# Patient Record
Sex: Male | Born: 1935
Health system: Southern US, Community
[De-identification: ages and names within clinical notes are randomized; demographics above are authoritative.]

## PROBLEM LIST (undated history)

## (undated) DIAGNOSIS — N400 Enlarged prostate without lower urinary tract symptoms: Secondary | ICD-10-CM

## (undated) DIAGNOSIS — I10 Essential (primary) hypertension: Secondary | ICD-10-CM

## (undated) DIAGNOSIS — I499 Cardiac arrhythmia, unspecified: Secondary | ICD-10-CM

## (undated) DIAGNOSIS — N189 Chronic kidney disease, unspecified: Secondary | ICD-10-CM

## (undated) DIAGNOSIS — M1990 Unspecified osteoarthritis, unspecified site: Secondary | ICD-10-CM

## (undated) DIAGNOSIS — E119 Type 2 diabetes mellitus without complications: Secondary | ICD-10-CM

## (undated) DIAGNOSIS — K649 Unspecified hemorrhoids: Secondary | ICD-10-CM

## (undated) HISTORY — PX: MEDIAL PARTIAL KNEE REPLACEMENT: SHX5965

## (undated) HISTORY — DX: Benign prostatic hyperplasia without lower urinary tract symptoms: N40.0

## (undated) HISTORY — DX: Unspecified hemorrhoids: K64.9

## (undated) HISTORY — DX: Unspecified osteoarthritis, unspecified site: M19.90

## (undated) HISTORY — PX: ROTATOR CUFF REPAIR: SHX139

## (undated) HISTORY — DX: Type 2 diabetes mellitus without complications: E11.9

## (undated) HISTORY — DX: Chronic kidney disease, unspecified: N18.9

## (undated) HISTORY — PX: INGUINAL HERNIA REPAIR: SUR1180

---

## 2002-12-15 ENCOUNTER — Ambulatory Visit (HOSPITAL_COMMUNITY): Admission: RE | Admit: 2002-12-15 | Discharge: 2002-12-15 | Payer: Self-pay | Admitting: Family Medicine

## 2002-12-15 ENCOUNTER — Encounter: Payer: Self-pay | Admitting: Family Medicine

## 2004-01-13 ENCOUNTER — Ambulatory Visit (HOSPITAL_COMMUNITY): Admission: RE | Admit: 2004-01-13 | Discharge: 2004-01-13 | Payer: Self-pay | Admitting: Family Medicine

## 2004-03-11 ENCOUNTER — Ambulatory Visit (HOSPITAL_COMMUNITY): Admission: RE | Admit: 2004-03-11 | Discharge: 2004-03-11 | Payer: Self-pay | Admitting: Family Medicine

## 2004-12-06 ENCOUNTER — Ambulatory Visit (HOSPITAL_COMMUNITY): Admission: RE | Admit: 2004-12-06 | Discharge: 2004-12-06 | Payer: Self-pay | Admitting: *Deleted

## 2004-12-06 ENCOUNTER — Ambulatory Visit (HOSPITAL_BASED_OUTPATIENT_CLINIC_OR_DEPARTMENT_OTHER): Admission: RE | Admit: 2004-12-06 | Discharge: 2004-12-06 | Payer: Self-pay | Admitting: *Deleted

## 2006-07-24 ENCOUNTER — Ambulatory Visit (HOSPITAL_COMMUNITY): Admission: RE | Admit: 2006-07-24 | Discharge: 2006-07-24 | Payer: Self-pay | Admitting: Family Medicine

## 2007-09-13 ENCOUNTER — Ambulatory Visit (HOSPITAL_COMMUNITY): Admission: RE | Admit: 2007-09-13 | Discharge: 2007-09-13 | Payer: Self-pay | Admitting: Family Medicine

## 2009-01-25 HISTORY — PX: COLONOSCOPY: SHX174

## 2010-08-12 NOTE — Op Note (Signed)
NAME:  Jacob Sullivan, Jacob Sullivan                 ACCOUNT NO.:  [id number]   MEDICAL RECORD NO.:  [id number]          PATIENT TYPE:  AMB   LOCATION:  DSC                          FACILITY:  [facility]   PHYSICIAN:  Deidre Ala, M.D.    DATE OF BIRTH:  11/22/1927   DATE OF PROCEDURE:  12/06/2004  DATE OF DISCHARGE:                                 OPERATIVE REPORT   PREOPERATIVE DIAGNOSIS:  1.  Left shoulder impingement syndrome with type 3 acromion.  2.  Acromioclavicular joint arthritis.  3.  Rule out rotator cuff tear.   POSTOPERATIVE DIAGNOSIS:  1.  Impingement syndrome with type 3 acromion, left shoulder.  2.  Partial thickness but not through and through rotator cuff tear with      flap.  3.  Severe acromioclavicular joint arthritis.  4.  Subdeltoid bursitis.  5.  Marked fraying of the glenoid labrum with type I SLAP without complete      detachment and intact long head biceps, glenohumeral joint looked good.   OPERATION:  1.  Left shoulder operative arthroscopy with subacromial arch decompression      acromioplasty.  2.  Arthroscopic distal clavicle resection.  3.  Subdeltoid bursectomy and debridement partial thickness rotator cuff      tear.  4.  Debridement glenoid labral tear and SLAP partial tear.   SURGEON:  1.  Charlesetta Shanks, M.D.   ASSISTANT:  Clarene Reamer, P.A.-C.   ANESTHESIA:  General endotracheal anesthesia.   PATHOLOGIC FINDINGS AND HISTORY:  [name] is a long term patient of mine  who presented to me seven months ago with increasing left shoulder pain with  the above x-rays.  He was given a cortisone injection.  He came back on  May 23, 2004, with improvement in his symptoms after injection on  May 02, 2004.  He then returned on November 21, 2004, was having  increasing difficulty, and desired to proceed with surgical intervention.   At surgery, he had glenoid labral fraying anteriorly.  His long head biceps  was intact.  There was some stretch of the SLAP  area attachment to the  biceps anchor but it was still intact.  He had fraying of the posterior  glenoid.  The glenohumeral surface looked good.  The under surface of the  acromion had marked synovitis and inflammation and frayed rotator cuff.  He  had a sharp type 2 acromion with very arthritic distal clavicle.  He had a  SLAP delamination of the rotator cuff centrally about 1 cm down from the  glenohumeral junction to 1.5 cm and it was rounded off but not further  debrided and it was not through and through.  He had fraying in the critical  zone.  This was also debrided to smooth with the ablator on 1.   PROCEDURE:  With adequate anesthesia obtained using endotracheal technique,  1 gram Ancef was given IV prophylaxis, the patient was placed in the supine  beach chair position.  The left shoulder was prepped and draped in a  standard fashion.  The patient had had a  scalene block prior.  The shoulder  was then prepped and draped in a standard fashion.  Skin markings were made  for anatomic positioning.  We then injected 20 mL of 0.5% Marcaine with  epinephrine to open up the subacromial space.  I then entered the shoulder  through a posterior portal, anterior portal was established just lateral to  the coracoid.  I then debrided, after probing, the anterior superior labrum,  and used the ablator on 1 to smooth.  Anterior triangle synovitis was also  removed as well as some synovitis on the under surface of the rotator cuff.  I then reversed the portals and similar shavings were carried out.  I then  entered the subacromial space through the posterior portal, anterolateral  portal was established.  I then debrided the anterior under surface of the  shaver and used the ablator on 1 to cauterize.  I then brought in the 6 bur  and completed acromioplasty to the roof of the subacromial space in the  manner of Caspari.  I then turned the scope medially sideways and debrided  the exiting meniscus  with basket and shaver.  I then debrided the distal  clavicle two shaver breadths in and smoothed it on the edges with an  ablator.  I then entered the shoulder from the anterolateral portal,  completed acromioplasty back to the bicortical bone in the manner of  Caspari.  I then debrided the rotator cuff to smooth, flat, and tapered over  the flap, with internal and external rotation, neutral, and abduction.  The  ablator was used on one to smooth.  Bleeding points were cauterized.  When I  was satisfied with the resection, the shoulder was irrigated through the  scope.  0.5% Marcaine was not used due to the block.  The portal was left  open.  A bulky, sterile compressive dressing was applied with a sling.  The  patient tolerated the procedure well, was awakened, and taken to the  recovery room in satisfactory condition.  He is to be per outpatient  routine, told to call the office tomorrow for appointment for recheck, given  Percocet for pain.           ______________________________  V. Charlesetta Shanks, M.D.     VEP/MEDQ  D:  12/06/2004  T:  12/06/2004  Job:  [id number]

## 2012-03-13 ENCOUNTER — Emergency Department (HOSPITAL_COMMUNITY): Payer: Medicare Other

## 2012-03-13 ENCOUNTER — Inpatient Hospital Stay (HOSPITAL_COMMUNITY)
Admission: EM | Admit: 2012-03-13 | Discharge: 2012-03-15 | DRG: 690 | Disposition: A | Discharge status: Home / Self Care | Payer: Medicare Other | Attending: Internal Medicine | Admitting: Internal Medicine

## 2012-03-13 ENCOUNTER — Encounter (HOSPITAL_COMMUNITY): Payer: Self-pay | Admitting: Emergency Medicine

## 2012-03-13 DIAGNOSIS — R6510 Systemic inflammatory response syndrome (SIRS) of non-infectious origin without acute organ dysfunction: Secondary | ICD-10-CM | POA: Diagnosis present

## 2012-03-13 DIAGNOSIS — N289 Disorder of kidney and ureter, unspecified: Secondary | ICD-10-CM

## 2012-03-13 DIAGNOSIS — I499 Cardiac arrhythmia, unspecified: Secondary | ICD-10-CM | POA: Diagnosis present

## 2012-03-13 DIAGNOSIS — I10 Essential (primary) hypertension: Secondary | ICD-10-CM | POA: Diagnosis present

## 2012-03-13 DIAGNOSIS — Z96659 Presence of unspecified artificial knee joint: Secondary | ICD-10-CM

## 2012-03-13 DIAGNOSIS — R000 Tachycardia, unspecified: Secondary | ICD-10-CM

## 2012-03-13 DIAGNOSIS — A419 Sepsis, unspecified organism: Secondary | ICD-10-CM

## 2012-03-13 DIAGNOSIS — R9431 Abnormal electrocardiogram [ECG] [EKG]: Secondary | ICD-10-CM | POA: Diagnosis present

## 2012-03-13 DIAGNOSIS — N390 Urinary tract infection, site not specified: Principal | ICD-10-CM | POA: Diagnosis present

## 2012-03-13 HISTORY — DX: Cardiac arrhythmia, unspecified: I49.9

## 2012-03-13 HISTORY — DX: Essential (primary) hypertension: I10

## 2012-03-13 LAB — CBC WITH DIFFERENTIAL/PLATELET
Basophils Absolute: 0 10*3/uL (ref 0.0–0.1)
Basophils Relative: 0 % (ref 0–1)
Eosinophils Absolute: 0 10*3/uL (ref 0.0–0.7)
Eosinophils Relative: 0 % (ref 0–5)
HCT: 41.3 % (ref 39.0–52.0)
Hemoglobin: 13.7 g/dL (ref 13.0–17.0)
Lymphocytes Relative: 3 % — ABNORMAL LOW (ref 12–46)
Lymphs Abs: 0.8 10*3/uL (ref 0.7–4.0)
MCH: 28.4 pg (ref 26.0–34.0)
MCHC: 33.2 g/dL (ref 30.0–36.0)
MCV: 85.5 fL (ref 78.0–100.0)
Monocytes Absolute: 1.5 10*3/uL — ABNORMAL HIGH (ref 0.1–1.0)
Monocytes Relative: 6 % (ref 3–12)
Neutro Abs: 23.5 10*3/uL — ABNORMAL HIGH (ref 1.7–7.7)
Neutrophils Relative %: 91 % — ABNORMAL HIGH (ref 43–77)
Platelets: 200 10*3/uL (ref 150–400)
RBC: 4.83 MIL/uL (ref 4.22–5.81)
RDW: 13.7 % (ref 11.5–15.5)
WBC: 25.8 10*3/uL — ABNORMAL HIGH (ref 4.0–10.5)

## 2012-03-13 LAB — URINALYSIS, ROUTINE W REFLEX MICROSCOPIC
Bilirubin Urine: NEGATIVE
Glucose, UA: NEGATIVE mg/dL
Ketones, ur: NEGATIVE mg/dL
Nitrite: POSITIVE — AB
Protein, ur: 30 mg/dL — AB
Specific Gravity, Urine: 1.025 (ref 1.005–1.030)
Urobilinogen, UA: 0.2 mg/dL (ref 0.0–1.0)
pH: 6 (ref 5.0–8.0)

## 2012-03-13 LAB — BASIC METABOLIC PANEL
BUN: 25 mg/dL — ABNORMAL HIGH (ref 6–23)
CO2: 27 mEq/L (ref 19–32)
Calcium: 8.9 mg/dL (ref 8.4–10.5)
Chloride: 97 mEq/L (ref 96–112)
Creatinine, Ser: 1.39 mg/dL — ABNORMAL HIGH (ref 0.50–1.35)
GFR calc Af Amer: 53 mL/min — ABNORMAL LOW (ref >90)
GFR calc non Af Amer: 46 mL/min — ABNORMAL LOW (ref >90)
Glucose, Bld: 163 mg/dL — ABNORMAL HIGH (ref 70–99)
Potassium: 3.5 mEq/L (ref 3.5–5.1)
Sodium: 136 mEq/L (ref 135–145)

## 2012-03-13 LAB — URINE MICROSCOPIC-ADD ON

## 2012-03-13 LAB — TSH: TSH: 3.419 u[IU]/mL (ref 0.350–4.500)

## 2012-03-13 LAB — T4, FREE: Free T4: 1.15 ng/dL (ref 0.80–1.80)

## 2012-03-13 LAB — TROPONIN I: Troponin I: <0.3 ng/mL (ref <0.30)

## 2012-03-13 LAB — T3, FREE: T3, Free: 1.9 pg/mL — ABNORMAL LOW (ref 2.3–4.2)

## 2012-03-13 MED ORDER — ENOXAPARIN SODIUM 40 MG/0.4ML ~~LOC~~ SOLN
40.0000 mg | SUBCUTANEOUS | Status: DC
  Administered 2012-03-13 – 2012-03-14 (×2): 40 mg via SUBCUTANEOUS
  Filled 2012-03-13 (×2): qty 0.4

## 2012-03-13 MED ORDER — SODIUM CHLORIDE 0.9 % IJ SOLN
3.0000 mL | Freq: Two times a day (BID) | INTRAMUSCULAR | Status: DC
  Administered 2012-03-14 (×2): 3 mL via INTRAVENOUS

## 2012-03-13 MED ORDER — SIMETHICONE 80 MG PO CHEW: 80.0000 mg | CHEWABLE_TABLET | Freq: Four times a day (QID) | ORAL | Status: DC | PRN

## 2012-03-13 MED ORDER — ALUM & MAG HYDROXIDE-SIMETH 200-200-20 MG/5ML PO SUSP: 30.0000 mL | Freq: Four times a day (QID) | ORAL | Status: DC | PRN

## 2012-03-13 MED ORDER — SENNOSIDES-DOCUSATE SODIUM 8.6-50 MG PO TABS: 1.0000 | ORAL_TABLET | Freq: Every evening | ORAL | Status: DC | PRN

## 2012-03-13 MED ORDER — ONDANSETRON HCL 4 MG/2ML IJ SOLN: 4.0000 mg | Freq: Four times a day (QID) | INTRAMUSCULAR | Status: DC | PRN

## 2012-03-13 MED ORDER — ACETAMINOPHEN 650 MG RE SUPP: 650.0000 mg | Freq: Four times a day (QID) | RECTAL | Status: DC | PRN

## 2012-03-13 MED ORDER — ONDANSETRON HCL 4 MG PO TABS: 4.0000 mg | ORAL_TABLET | Freq: Four times a day (QID) | ORAL | Status: DC | PRN

## 2012-03-13 MED ORDER — DEXTROSE 5 % IV SOLN
1.0000 g | INTRAVENOUS | Status: AC
  Administered 2012-03-14: 1 g via INTRAVENOUS
  Filled 2012-03-13: qty 10

## 2012-03-13 MED ORDER — TRAZODONE HCL 50 MG PO TABS: 50.0000 mg | ORAL_TABLET | Freq: Every evening | ORAL | Status: DC | PRN

## 2012-03-13 MED ORDER — POTASSIUM CHLORIDE IN NACL 40-0.9 MEQ/L-% IV SOLN
INTRAVENOUS | Status: DC
  Administered 2012-03-13 – 2012-03-14 (×3): via INTRAVENOUS

## 2012-03-13 MED ORDER — ASPIRIN EC 81 MG PO TBEC
81.0000 mg | DELAYED_RELEASE_TABLET | Freq: Every day | ORAL | Status: DC
  Administered 2012-03-13 – 2012-03-15 (×3): 81 mg via ORAL
  Filled 2012-03-13 (×3): qty 1

## 2012-03-13 MED ORDER — DEXTROSE 5 % IV SOLN
1.0000 g | Freq: Once | INTRAVENOUS | Status: AC
  Administered 2012-03-13: 1 g via INTRAVENOUS
  Filled 2012-03-13: qty 10

## 2012-03-13 MED ORDER — ACETAMINOPHEN 325 MG PO TABS: 650.0000 mg | ORAL_TABLET | Freq: Four times a day (QID) | ORAL | Status: DC | PRN

## 2012-03-13 MED ORDER — OXYCODONE HCL 5 MG PO TABS: 5.0000 mg | ORAL_TABLET | ORAL | Status: DC | PRN

## 2012-03-13 NOTE — ED Notes (Signed)
Pt c/o dizziness and palpitation since Monday. Pt was sent over by Dr. Phillips Odor. Pt is being treated for uti.

## 2012-03-13 NOTE — H&P (Signed)
Hospital Admission Note Date: 03/13/2012  Patient name: Jacob Sullivan Medical record number: [id number] Date of birth: February 14, 1928 Age: 78 y.o. Gender: male PCP: Kirk Ruths, MD  Chief Complaint: fever, dysuria  History of Present Illness:  Jacob Sullivan is an 78 y.o. male who presents with fevers to 100 at home, dysuria, frequency, nocturia. He has been weak. He felt nauseated yesterday. His heart has been racing. He went to see Dr. Phillips Odor and was found to have tachycardia and urinary tract infection. He was sent to the emergency room. Patient has a Sweda blood cell count of 25,000, urinary tract infection. His appetite has been poor. He is not following, but feels very dizzy. He is usually quite independent. He has received IV fluids and ceftriaxone in the emergency room.  Past Medical History  Diagnosis Date  . Irregular heartbeat   . Hypertension     Meds: See medicine reconciliation  Allergies: Review of patient's allergies indicates no known allergies. History   Social History  . Marital Status: Married    Spouse Name: N/A    Number of Children: N/A  . Years of Education: N/A   Occupational History  . Not on file.   Social History Main Topics  . Smoking status: Not on file  . Smokeless tobacco: Not on file  . Alcohol Use: No  . Drug Use: No  . Sexually Active:    Other Topics Concern  . Not on file   Social History Narrative  . No narrative on file   Family history: No urinary problems.  Past Surgical History  Procedure Date  . Medial partial knee replacement   . Rotator cuff repair     Review of Systems: Systems reviewed and as per HPI, otherwise negative.  Physical Exam: Blood pressure 113/58, pulse 105, temperature 97.6 F (36.4 C), temperature source Oral, resp. rate 18, height 5\' 9"  (1.753 m), weight 87.544 kg (193 lb), SpO2 98.00%. BP 113/58  Pulse 105  Temp 97.6 F (36.4 C) (Oral)  Resp 18  Ht 5\' 9"  (1.753 m)  Wt 87.544 kg (193 lb)   BMI 28.50 kg/m2  SpO2 98%  General Appearance:    Alert, slightly ill-appearing black male, oriented and cooperative   Head:    Normocephalic, without obvious abnormality, atraumatic  Eyes:    PERRL, conjunctiva/corneas clear, EOM's intact, fundi    benign, both eyes       Ears:    Normal TM's and external ear canals, both ears  Nose:   Nares normal, septum midline, mucosa normal, no drainage    or sinus tenderness  Throat:   Lips, mucosa, and tongue normal; teeth and gums normal  Neck:   Supple, symmetrical, trachea midline, no adenopathy;       thyroid:  No enlargement/tenderness/nodules; no carotid   bruit or JVD  Back:     Symmetric, no curvature, ROM normal, no CVA tenderness  Lungs:     Clear to auscultation bilaterally, respirations unlabored  Chest wall:    No tenderness or deformity  Heart:    Regular rate and rhythm, S1 and S2 normal, no murmur, rub   or gallop  Abdomen:     Soft, non-tender, bowel sounds active all four quadrants,    no masses, no organomegaly  Genitalia:   deferred   Rectal:   deferred   Extremities:   Extremities normal, atraumatic, no cyanosis or edema  Pulses:   2+ and symmetric all extremities  Skin:  Skin color, texture, turgor normal, no rashes or lesions  Lymph nodes:   Cervical, supraclavicular, and axillary nodes normal  Neurologic:   CNII-XII intact. Normal strength, sensation and reflexes      throughout    Psychiatric: Normal affect.  Lab results: Basic Metabolic Panel:  [facility] 03/13/12 0902  NA 136  K 3.5  CL 97  CO2 27  GLUCOSE 163*  BUN 25*  CREATININE 1.39*  CALCIUM 8.9  MG --  PHOS --  CBC:  Basename 03/13/12 0902  WBC 25.8*  NEUTROABS 23.5*  HGB 13.7  HCT 41.3  MCV 85.5  PLT 200   Cardiac Enzymes:  Basename 03/13/12 0902  CKTOTAL --  CKMB --  CKMBINDEX --  TROPONINI <0.30   Thyroid Function Tests:  Basename 03/13/12 0902  TSH 3.419  T4TOTAL --  FREET4 1.15  T3FREE 1.9*  THYROIDAB --   Urinalysis:  Basename 03/13/12 1158  COLORURINE YELLOW  LABSPEC 1.025  PHURINE 6.0  GLUCOSEU NEGATIVE  HGBUR LARGE*  BILIRUBINUR NEGATIVE  KETONESUR NEGATIVE  PROTEINUR 30*  UROBILINOGEN 0.2  NITRITE POSITIVE*  LEUKOCYTESUR MODERATE*   Imaging results:  Dg Chest 2 View  03/13/2012  *RADIOLOGY REPORT*  Clinical Data: Dizziness and palpitations.  CHEST - 2 VIEW  Comparison: 09/13/2007.  Findings: Trachea is midline.  Heart size stable.  Lungs are low in volume with bibasilar atelectasis.  No pleural fluid. Prominent osteophytes in the thoracic spine.  IMPRESSION: Low lung volumes with bibasilar atelectasis.   Original Report Authenticated By: Leanna Battles, M.D.    Ct Head Wo Contrast  03/13/2012  *RADIOLOGY REPORT*  Clinical Data: Dizziness and confusion.  CT HEAD WITHOUT CONTRAST  Technique:  Contiguous axial images were obtained from the base of the skull through the vertex without contrast.  Comparison: None.  Findings: There is no evidence for acute hemorrhage, hydrocephalus, mass lesion, or abnormal extra-axial fluid collection.  No definite CT evidence for acute infarction.  Subtle volume loss is noted.  No substantial Zettlemoyer matter changes. The visualized paranasal sinuses and mastoid air cells are clear.  IMPRESSION: CT exam of the brain is normal for age.   Original Report Authenticated By: Kennith Center, M.D.    EKG shows sinus tachycardia with lateral ST depression. No old EKG for comparison.  Assessment & Plan: Principal Problem:  *UTI (lower urinary tract infection) Active Problems:  SIRS (systemic inflammatory response syndrome)  Essential hypertension  Renal insufficiency, no old labs for comparison.  abnormal EKG with a normal troponin and no chest pain. No old EKG available  Patient will be admitted to telemetry. Give IV fluids, Rocephin, followup culture results. Hold antihypertensives. Repeat labs and EKG in the morning.  Coe Angelos L 03/13/2012, 1:50  PM

## 2012-03-13 NOTE — ED Notes (Signed)
Pt unable to void at this time. 

## 2012-03-13 NOTE — ED Provider Notes (Signed)
History     CSN: [id number]  Arrival date & time 03/13/12  7829   First MD Initiated Contact with Patient 03/13/12 0930      Chief Complaint  Patient presents with  . Dizziness  . Palpitations    (Consider location/radiation/quality/duration/timing/severity/associated sxs/prior treatment) HPI Comments: She was sent to ER by his primary care doctor. Patient reports that he saw his doctor this morning because of ongoing dizziness. He says he has been having dizziness for the last 3 weeks. He is not experiencing vertigo, dizziness is described as a "being off balance". Dizziness worsens with movement of his head. He does not have headache or vision change. Patient was sent to the ER after evaluation by the primary care doctor, because he was noted to be tachycardic. Patient reportedly has been experiencing palpitations and fast heart rate since last night. He has no associated chest pain or shortness of breath.  Patient reports that he was told that he had ear infection and urinary tract infection primary care doctor before being sent to the ER. He was not given any antibiotic coverage for this.  Patient is a 78 y.o. male presenting with palpitations.  Palpitations  Associated symptoms include dizziness. Pertinent negatives include no fever, no numbness, no chest pain, no abdominal pain, no headaches, no back pain and no shortness of breath.    Past Medical History  Diagnosis Date  . Irregular heartbeat   . Hypertension     Past Surgical History  Procedure Date  . Medial partial knee replacement   . Rotator cuff repair     History reviewed. No pertinent family history.  History  Substance Use Topics  . Smoking status: Not on file  . Smokeless tobacco: Not on file  . Alcohol Use: No      Review of Systems  Constitutional: Negative for fever.  Respiratory: Negative for chest tightness and shortness of breath.   Cardiovascular: Positive for palpitations. Negative for  chest pain and leg swelling.  Gastrointestinal: Negative for abdominal pain.  Genitourinary: Positive for dysuria.  Musculoskeletal: Negative for back pain.  Skin: Negative for color change.  Neurological: Positive for dizziness and light-headedness. Negative for seizures, syncope, numbness and headaches.  All other systems reviewed and are negative.    Allergies  Review of patient's allergies indicates no known allergies.  Home Medications   Current Outpatient Rx  Name  Route  Sig  Dispense  Refill  . ASPIRIN EC 81 MG PO TBEC   Oral   Take 81 mg by mouth daily.         Marland Kitchen CETIRIZINE HCL 10 MG PO TABS   Oral   Take 10 mg by mouth daily.         Marland Kitchen GARLIC 1000 MG PO CAPS   Oral   Take 1,000 mg by mouth daily. patient states he takes every once in a while         . LOSARTAN POTASSIUM-HCTZ 100-25 MG PO TABS   Oral   Take 1 tablet by mouth daily.         . ADULT MULTIVITAMIN W/MINERALS CH   Oral   Take 1 tablet by mouth daily.         Marland Kitchen SIMETHICONE 125 MG PO CHEW   Oral   Chew 125 mg by mouth every 6 (six) hours as needed. gas         . VITAMIN B-12 1000 MCG PO TABS   Oral   Take 1,000  mcg by mouth daily as needed. patient does not take on a regular basis         . VITAMIN C 500 MG PO TABS   Oral   Take 500 mg by mouth daily.           BP 124/56  Pulse 115  Temp 97.6 F (36.4 C) (Oral)  Ht 5\' 9"  (1.753 m)  Wt 193 lb (87.544 kg)  BMI 28.50 kg/m2  SpO2 98%  Physical Exam  Constitutional: He is oriented to person, place, and time. He appears well-developed and well-nourished.  HENT:  Head: Normocephalic and atraumatic.  Right Ear: External ear normal.  Left Ear: External ear normal.  Nose: Nose normal.  Mouth/Throat: Oropharynx is clear and moist.  Eyes: Conjunctivae normal are normal. Pupils are equal, round, and reactive to light.  Neck: Normal range of motion. Neck supple.  Cardiovascular: Regular rhythm.  Exam reveals friction rub. Exam  reveals no gallop.   No murmur heard.      TACHY  Pulmonary/Chest: Effort normal and breath sounds normal. No respiratory distress. He has no wheezes. He has no rales. He exhibits no tenderness.  Abdominal: Soft. He exhibits no distension. There is no tenderness.  Musculoskeletal: Normal range of motion. He exhibits no edema.  Neurological: He is alert and oriented to person, place, and time. He has normal reflexes. No cranial nerve deficit. Coordination normal.       Equal and symmetric strength in all 4 extremities. Normal finger to nose bilaterally.  Skin: Skin is warm.    ED Course  Procedures (including critical care time)   Date: 03/13/2012  Rate: 113  Rhythm: sinus tachycardia  QRS Axis: normal  Intervals: normal  ST/T Wave abnormalities: ST depressions inferiorly and ST depressions laterally  Conduction Disutrbances:none  Narrative Interpretation:   Old EKG Reviewed: none available    Labs Reviewed  URINALYSIS, ROUTINE W REFLEX MICROSCOPIC - Abnormal; Notable for the following:    Hgb urine dipstick LARGE (*)     Protein, ur 30 (*)     Nitrite POSITIVE (*)     Leukocytes, UA MODERATE (*)     All other components within normal limits  CBC WITH DIFFERENTIAL - Abnormal; Notable for the following:    WBC 25.8 (*)     Neutrophils Relative 91 (*)     Lymphocytes Relative 3 (*)     Neutro Abs 23.5 (*)     Monocytes Absolute 1.5 (*)     All other components within normal limits  BASIC METABOLIC PANEL - Abnormal; Notable for the following:    Glucose, Bld 163 (*)     BUN 25 (*)     Creatinine, Ser 1.39 (*)     GFR calc non Af Amer 46 (*)     GFR calc Af Amer 53 (*)     All other components within normal limits  TROPONIN I  URINE MICROSCOPIC-ADD ON  T3, FREE  T4, FREE  TSH  CULTURE, BLOOD (ROUTINE X 2)  CULTURE, BLOOD (ROUTINE X 2)  URINE CULTURE   Dg Chest 2 View  03/13/2012  *RADIOLOGY REPORT*  Clinical Data: Dizziness and palpitations.  CHEST - 2 VIEW   Comparison: 09/13/2007.  Findings: Trachea is midline.  Heart size stable.  Lungs are low in volume with bibasilar atelectasis.  No pleural fluid. Prominent osteophytes in the thoracic spine.  IMPRESSION: Low lung volumes with bibasilar atelectasis.   Original Report Authenticated By: Leanna Battles, M.D.  Ct Head Wo Contrast  03/13/2012  *RADIOLOGY REPORT*  Clinical Data: Dizziness and confusion.  CT HEAD WITHOUT CONTRAST  Technique:  Contiguous axial images were obtained from the base of the skull through the vertex without contrast.  Comparison: None.  Findings: There is no evidence for acute hemorrhage, hydrocephalus, mass lesion, or abnormal extra-axial fluid collection.  No definite CT evidence for acute infarction.  Subtle volume loss is noted.  No substantial Stines matter changes. The visualized paranasal sinuses and mastoid air cells are clear.  IMPRESSION: CT exam of the brain is normal for age.   Original Report Authenticated By: Kennith Center, M.D.      1. Sepsis   2. UTI (urinary tract infection)   3. Tachycardia       MDM  Patient sent to the emergency department by primary care physician after evaluation this morning. Patient initially presented for evaluation of dizziness. He was noted to be tachycardic in the office and was sent to the ER for further evaluation. Patient did have diagnosis of urinary tract infection made in the office earlier, no medications administered.  The patient continues to complain of intermittent dizziness that is positional in nature. Does not have any vertiginous symptoms, however. His neurologic examination was entirely unremarkable and reassuring. Head CT was negative.  Patient was found to be tachycardic. He has a sinus tachycardia with significant amount of ectopy/PVCs.  Blood work did show significant leukocytosis. Chest x-ray was clear. Patient's urinalysis shows obvious infection. This appears to be the source of the infection. As the patient  is not a significant leukocytosis and is tachycardic, this is consistent with early sepsis. Blood and urine cultures are pending. Patient is to Rocephin here in the ER. He will require hospitalization for further treatment. Discussed with Dr. Lendell Caprice, on-call for [facility].        Gilda Crease, MD 03/13/12 1323

## 2012-03-13 NOTE — ED Notes (Signed)
Called to give report, Norberta Keens to call me back

## 2012-03-14 DIAGNOSIS — R9431 Abnormal electrocardiogram [ECG] [EKG]: Secondary | ICD-10-CM | POA: Diagnosis present

## 2012-03-14 LAB — CBC WITH DIFFERENTIAL/PLATELET
Basophils Absolute: 0 10*3/uL (ref 0.0–0.1)
Eosinophils Relative: 1 % (ref 0–5)
Lymphocytes Relative: 8 % — ABNORMAL LOW (ref 12–46)
Lymphs Abs: 1.4 10*3/uL (ref 0.7–4.0)
MCV: 85.1 fL (ref 78.0–100.0)
Neutro Abs: 15.2 10*3/uL — ABNORMAL HIGH (ref 1.7–7.7)
Neutrophils Relative %: 84 % — ABNORMAL HIGH (ref 43–77)
Platelets: 176 10*3/uL (ref 150–400)
RBC: 4.3 MIL/uL (ref 4.22–5.81)
RDW: 13.7 % (ref 11.5–15.5)
WBC: 18 10*3/uL — ABNORMAL HIGH (ref 4.0–10.5)

## 2012-03-14 LAB — BASIC METABOLIC PANEL
CO2: 26 mEq/L (ref 19–32)
Chloride: 106 mEq/L (ref 96–112)
Glucose, Bld: 117 mg/dL — ABNORMAL HIGH (ref 70–99)
Potassium: 3.8 mEq/L (ref 3.5–5.1)
Sodium: 140 mEq/L (ref 135–145)

## 2012-03-14 LAB — HEMOGLOBIN A1C: Mean Plasma Glucose: 140 mg/dL — ABNORMAL HIGH (ref <117)

## 2012-03-14 MED ORDER — CEFUROXIME AXETIL 250 MG PO TABS
500.0000 mg | ORAL_TABLET | Freq: Two times a day (BID) | ORAL | Status: DC
  Administered 2012-03-15: 500 mg via ORAL
  Filled 2012-03-14: qty 2

## 2012-03-14 NOTE — Care Management Note (Signed)
    Page 1 of 1   03/15/2012     11:57:17 AM   CARE MANAGEMENT NOTE 03/15/2012  Patient:  Jacob Sullivan, Jacob Sullivan   Account Number:  [id number]  Date Initiated:  03/14/2012  Documentation initiated by:  Rosemary Holms  Subjective/Objective Assessment:   Pt lives at home with spouse. Great family support at DC. No needs identified     Action/Plan:   Anticipated DC Date:  03/15/2012   Anticipated DC Plan:  HOME/SELF CARE      DC Planning Services  CM consult      Choice offered to / List presented to:             Status of service:  Completed, signed off Medicare Important Message given?  NA - LOS <3 / Initial given by admissions (If response is "NO", the following Medicare IM given date fields will be blank) Date Medicare IM given:   Date Additional Medicare IM given:    Discharge Disposition:  HOME/SELF CARE  Per UR Regulation:    If discussed at Long Length of Stay Meetings, dates discussed:    Comments:  03/15/12 1156 Arlyss Queen, RN BSN CM Pt discharged home today. No CM or HH needs noted.  03/14/12 1155 Amy Leanord Hawking RN BSN CM

## 2012-03-14 NOTE — Progress Notes (Signed)
Subjective: Feels a little better today. Still with urinary frequency. No evidence of urinary retention. Feels stronger. Appetite better. No pain.  Objective: Vital signs in last 24 hours: Filed Vitals:   03/13/12 1600 03/13/12 1653 03/13/12 2126 03/14/12 0555  BP:   113/56 130/52  Pulse:   106 110  Temp:  98.9 F (37.2 C) 98.7 F (37.1 C) 98.4 F (36.9 C)  TempSrc:  Oral Oral Oral  Resp:   20 20  Height: 5\' 9"  (1.753 m)     Weight: 87.544 kg (193 lb)     SpO2:   94% 97%   Weight change:   Intake/Output Summary (Last 24 hours) at 03/14/12 0944 Last data filed at 03/14/12 0640  Gross per 24 hour  Intake 2102.5 ml  Output    120 ml  Net 1982.5 ml   General: Alert, oriented. Watching TV. Nontoxic appearing. Lungs clear to auscultation bilaterally without wheezes rhonchi or rales Cardiovascular regular rate rhythm without murmurs gas rubs Abdomen soft nontender nondistended Extremities no clubbing cyanosis or edema Back no CVA tenderness  Lab Results: Basic Metabolic Panel:  Lab 03/14/12 1610 03/13/12 0902  NA 140 136  K 3.8 3.5  CL 106 97  CO2 26 27  GLUCOSE 117* 163*  BUN 25* 25*  CREATININE 0.91 1.39*  CALCIUM 8.1* 8.9  MG -- --  PHOS -- --   Liver Function Tests: No results found for this basename: AST:2,ALT:2,ALKPHOS:2,BILITOT:2,PROT:2,ALBUMIN:2 in the last 168 hours No results found for this basename: LIPASE:2,AMYLASE:2 in the last 168 hours No results found for this basename: AMMONIA:2 in the last 168 hours CBC:  Lab 03/14/12 0521 03/13/12 0902  WBC 18.0* 25.8*  NEUTROABS 15.2* 23.5*  HGB 12.1* 13.7  HCT 36.6* 41.3  MCV 85.1 85.5  PLT 176 200   Cardiac Enzymes:  Lab 03/13/12 0902  CKTOTAL --  CKMB --  CKMBINDEX --  TROPONINI <0.30   BNP: No results found for this basename: PROBNP:3 in the last 168 hours D-Dimer: No results found for this basename: DDIMER:2 in the last 168 hours CBG: No results found for this basename: GLUCAP:6 in the  last 168 hours Hemoglobin A1C:  Lab 03/13/12 0900  HGBA1C 6.5*   Fasting Lipid Panel: No results found for this basename: CHOL,HDL,LDLCALC,TRIG,CHOLHDL,LDLDIRECT in the last 960 hours Thyroid Function Tests:  Lab 03/13/12 0902  TSH 3.419  T4TOTAL --  FREET4 1.15  T3FREE 1.9*  THYROIDAB --   Coagulation: No results found for this basename: LABPROT:4,INR:4 in the last 168 hours Anemia Panel: No results found for this basename: VITAMINB12,FOLATE,FERRITIN,TIBC,IRON,RETICCTPCT in the last 168 hours Urine Drug Screen: Drugs of Abuse  No results found for this basename: labopia, cocainscrnur, labbenz, amphetmu, thcu, labbarb    Alcohol Level: No results found for this basename: ETH:2 in the last 168 hours Urinalysis:  Lab 03/13/12 1158  COLORURINE YELLOW  LABSPEC 1.025  PHURINE 6.0  GLUCOSEU NEGATIVE  HGBUR LARGE*  BILIRUBINUR NEGATIVE  KETONESUR NEGATIVE  PROTEINUR 30*  UROBILINOGEN 0.2  NITRITE POSITIVE*  LEUKOCYTESUR MODERATE*   Micro Results: No results found for this or any previous visit (from the past 240 hour(s)).  Repeat EKG: Normal sinus rhythm with PVCs. ST depression resolved.  Scheduled Meds:   . aspirin EC  81 mg Oral Daily  . cefTRIAXone (ROCEPHIN)  IV  1 g Intravenous Q24H  . enoxaparin (LOVENOX) injection  40 mg Subcutaneous Q24H  . sodium chloride  3 mL Intravenous Q12H   Continuous Infusions:   .  0.9 % NaCl with KCl 40 mEq / Sullivan 125 mL/hr at 03/14/12 0936   PRN Meds:.acetaminophen, acetaminophen, alum & mag hydroxide-simeth, ondansetron (ZOFRAN) IV, ondansetron, oxyCODONE, senna-docusate, simethicone, traZODone Assessment/Plan: Principal Problem:  *UTI (lower urinary tract infection) Active Problems:  SIRS (systemic inflammatory response syndrome)  Essential hypertension  Renal insufficiency  Improving. Saline lock IV. Change to oral antibiotics tomorrow. Increase activity. Discontinue telemetry. Await cultures. Home tomorrow if stable.    LOS: 1 day   Jacob Sullivan 03/14/2012, 9:44 AM

## 2012-03-14 NOTE — Progress Notes (Signed)
UR Chart Review Completed  

## 2012-03-15 LAB — URINE CULTURE: Colony Count: >=100000

## 2012-03-15 MED ORDER — TAMSULOSIN HCL 0.4 MG PO CAPS: 0.4000 mg | ORAL_CAPSULE | Freq: Every day | ORAL | Status: AC

## 2012-03-15 MED ORDER — CEPHALEXIN 500 MG PO CAPS: 500.0000 mg | ORAL_CAPSULE | Freq: Two times a day (BID) | ORAL | Status: DC

## 2012-03-15 MED ORDER — ACETAMINOPHEN 325 MG PO TABS: 650.0000 mg | ORAL_TABLET | Freq: Four times a day (QID) | ORAL | Status: AC | PRN

## 2012-03-15 NOTE — Accreditation Note (Signed)
Pt and wife verbalize understanding of d/c instructions, follow up appts, and new prescriptions. IV d/c. No questions at this time. I d/c pt via wheelchair to main entrance where wife was waiting for pt. Wife will be driving pt home. Sheryn Bison

## 2012-03-15 NOTE — Discharge Summary (Signed)
Physician Discharge Summary  Patient ID: Jacob Sullivan MRN: [id number] DOB/AGE: [birth year] 78 y.o.  Admit date: 03/13/2012 Discharge date: 03/15/2012  Discharge Diagnoses:  Principal Problem:  E coli UTI (lower urinary tract infection) Active Problems:  SIRS (systemic inflammatory response syndrome)  Abnormal EKG  Essential hypertension  Renal insufficiency     Medication List     As of 03/15/2012 10:43 AM    TAKE these medications         acetaminophen 325 MG tablet   Commonly known as: TYLENOL   Take 2 tablets (650 mg total) by mouth every 6 (six) hours as needed (or Fever >/= 101).      aspirin EC 81 MG tablet   Take 81 mg by mouth daily.      cephALEXin 500 MG capsule   Commonly known as: KEFLEX   Take 1 capsule (500 mg total) by mouth 2 (two) times daily.      cetirizine 10 MG tablet   Commonly known as: ZYRTEC   Take 10 mg by mouth daily.      Garlic 1000 MG Caps   Take 1,000 mg by mouth daily. patient states he takes every once in a while      losartan-hydrochlorothiazide 100-25 MG per tablet   Commonly known as: HYZAAR   Take 1 tablet by mouth daily.      multivitamin with minerals Tabs   Take 1 tablet by mouth daily.      simethicone 125 MG chewable tablet   Commonly known as: MYLICON   Chew 125 mg by mouth every 6 (six) hours as needed. gas      Tamsulosin HCl 0.4 MG Caps   Commonly known as: FLOMAX   Take 1 capsule (0.4 mg total) by mouth at bedtime.      vitamin B-12 1000 MCG tablet   Commonly known as: CYANOCOBALAMIN   Take 1,000 mcg by mouth daily as needed. patient does not take on a regular basis      vitamin C 500 MG tablet   Commonly known as: ASCORBIC ACID   Take 500 mg by mouth daily.            Discharge Orders    Future Orders Please Complete By Expires   Diet - low sodium heart healthy      Increase activity slowly         Follow-up Information    Follow up with Kirk Ruths, MD. In 1 month. (to check prostate)     Contact information:   [address] [place] [address] [phone]          Disposition: home  Discharged Condition: stable  Consults:  none  Labs:   Results for orders placed during the hospital encounter of 03/13/12 (from the past 48 hour(s))  URINALYSIS, ROUTINE W REFLEX MICROSCOPIC     Status: Abnormal   Collection Time   03/13/12 11:58 AM      Component Value Range Comment   Color, Urine YELLOW  YELLOW    APPearance CLEAR  CLEAR    Specific Gravity, Urine 1.025  1.005 - 1.030    pH 6.0  5.0 - 8.0    Glucose, UA NEGATIVE  NEGATIVE mg/dL    Hgb urine dipstick LARGE (*) NEGATIVE    Bilirubin Urine NEGATIVE  NEGATIVE    Ketones, ur NEGATIVE  NEGATIVE mg/dL    Protein, ur 30 (*) NEGATIVE mg/dL    Urobilinogen, UA  0.2  0.0 - 1.0 mg/dL    Nitrite POSITIVE (*) NEGATIVE    Leukocytes, UA MODERATE (*) NEGATIVE   URINE CULTURE     Status: Normal   Collection Time   03/13/12 11:58 AM      Component Value Range Comment   Specimen Description URINE, CLEAN CATCH      Special Requests NONE      Culture  Setup Time 03/13/2012 13:45      Colony Count >=100,000 COLONIES/ML      Culture ESCHERICHIA COLI      Report Status 03/15/2012 FINAL      Organism ID, Bacteria ESCHERICHIA COLI     URINE MICROSCOPIC-ADD ON     Status: Normal   Collection Time   03/13/12 11:58 AM      Component Value Range Comment   WBC, UA TOO NUMEROUS TO COUNT  <3 WBC/hpf    RBC / HPF TOO NUMEROUS TO COUNT  <3 RBC/hpf   CULTURE, BLOOD (ROUTINE X 2)     Status: Normal (Preliminary result)   Collection Time   03/13/12 12:03 PM      Component Value Range Comment   Specimen Description BLOOD RIGHT HAND      Special Requests BOTTLES DRAWN AEROBIC AND ANAEROBIC 6CC      Culture NO GROWTH 2 DAYS      Report Status PENDING     CULTURE, BLOOD (ROUTINE X 2)     Status: Normal (Preliminary result)   Collection Time   03/13/12 12:04 PM      Component Value Range Comment    Specimen Description BLOOD LEFT ANTECUBITAL      Special Requests BOTTLES DRAWN AEROBIC AND ANAEROBIC 6CC      Culture NO GROWTH 2 DAYS      Report Status PENDING     CBC WITH DIFFERENTIAL     Status: Abnormal   Collection Time   03/14/12  5:21 AM      Component Value Range Comment   WBC 18.0 (*) 4.0 - 10.5 K/uL    RBC 4.30  4.22 - 5.81 MIL/uL    Hemoglobin 12.1 (*) 13.0 - 17.0 g/dL    HCT 81.1 (*) 91.4 - 52.0 %    MCV 85.1  78.0 - 100.0 fL    MCH 28.1  26.0 - 34.0 pg    MCHC 33.1  30.0 - 36.0 g/dL    RDW 78.2  95.6 - 21.3 %    Platelets 176  150 - 400 K/uL    Neutrophils Relative 84 (*) 43 - 77 %    Neutro Abs 15.2 (*) 1.7 - 7.7 K/uL    Lymphocytes Relative 8 (*) 12 - 46 %    Lymphs Abs 1.4  0.7 - 4.0 K/uL    Monocytes Relative 7  3 - 12 %    Monocytes Absolute 1.2 (*) 0.1 - 1.0 K/uL    Eosinophils Relative 1  0 - 5 %    Eosinophils Absolute 0.2  0.0 - 0.7 K/uL    Basophils Relative 0  0 - 1 %    Basophils Absolute 0.0  0.0 - 0.1 K/uL   BASIC METABOLIC PANEL     Status: Abnormal   Collection Time   03/14/12  5:21 AM      Component Value Range Comment   Sodium 140  135 - 145 mEq/L    Potassium 3.8  3.5 - 5.1 mEq/L    Chloride 106  96 - 112  mEq/L DELTA CHECK NOTED   CO2 26  19 - 32 mEq/L    Glucose, Bld 117 (*) 70 - 99 mg/dL    BUN 25 (*) 6 - 23 mg/dL    Creatinine, Ser 1.61  0.50 - 1.35 mg/dL    Calcium 8.1 (*) 8.4 - 10.5 mg/dL    GFR calc non Af Amer 77 (*) >90 mL/min    GFR calc Af Amer 89 (*) >90 mL/min     Diagnostics:  Dg Chest 2 View  03/13/2012  *RADIOLOGY REPORT*  Clinical Data: Dizziness and palpitations.  CHEST - 2 VIEW  Comparison: 09/13/2007.  Findings: Trachea is midline.  Heart size stable.  Lungs are low in volume with bibasilar atelectasis.  No pleural fluid. Prominent osteophytes in the thoracic spine.  IMPRESSION: Low lung volumes with bibasilar atelectasis.   Original Report Authenticated By: Leanna Battles, M.D.    Ct Head Wo Contrast  03/13/2012   *RADIOLOGY REPORT*  Clinical Data: Dizziness and confusion.  CT HEAD WITHOUT CONTRAST  Technique:  Contiguous axial images were obtained from the base of the skull through the vertex without contrast.  Comparison: None.  Findings: There is no evidence for acute hemorrhage, hydrocephalus, mass lesion, or abnormal extra-axial fluid collection.  No definite CT evidence for acute infarction.  Subtle volume loss is noted.  No substantial Sherry matter changes. The visualized paranasal sinuses and mastoid air cells are clear.  IMPRESSION: CT exam of the brain is normal for age.   Original Report Authenticated By: Kennith Center, M.D.    EKG: sinus tachycardia with lateral ST  Depression.  repeat EKG showed normal sinus rhythm with PVCs and resolution of ST depression per  Full Code   Hospital Course: See H&P for complete admission details. The patient is an 78 year old black male who presented with dysuria, frequency, weakness and fever. His appetite was poor. Initially, he was seen in his primary care provider's office. He was noted to be tachycardic in the office and therefore sent to the emergency room. He had tachycardia, otherwise normal vital signs. He appeared slightly ill. Otherwise exam was fairly unremarkable. He had leukocytosis, urinary tract infection, and an abnormal EKG. He had no chest pain however. He was admitted, started on antibiotics, IV fluids. He ruled out for MI. Repeat EKG normalized. By the time of discharge, he was feeling much stronger, his appetite was improved, his urinary frequency and dysuria was improved. Escherichia coli grew from the culture which was sensitive to Ancef. He has therefore been sent home on cefalexin. Total time of day of discharge greater than 30 minutes.  Discharge Exam:  Blood pressure 157/79, pulse 92, temperature 99.3 F (37.4 C), temperature source Oral, resp. rate 18, height 5\' 9"  (1.753 m), weight 87.544 kg (193 lb), SpO2 96.00%.  General: Alert,  oriented. Nontoxic. Lungs clear to auscultation bilaterally without wheeze rhonchi or rales Cardiovascular regular rate rhythm without murmurs gallops rubs Abdomen soft nontender nondistended Extremities no clubbing cyanosis or edema next  Signed: Eleuterio Dollar L 03/15/2012, 10:43 AM

## 2012-03-18 LAB — CULTURE, BLOOD (ROUTINE X 2)
Culture: NO GROWTH
Culture: NO GROWTH

## 2012-09-02 ENCOUNTER — Ambulatory Visit (HOSPITAL_COMMUNITY)
Admission: RE | Admit: 2012-09-02 | Discharge: 2012-09-02 | Disposition: A | Discharge status: Home / Self Care | Payer: Medicare Other | Source: Ambulatory Visit | Attending: Family Medicine | Admitting: Family Medicine

## 2012-09-02 ENCOUNTER — Other Ambulatory Visit (HOSPITAL_COMMUNITY): Payer: Self-pay | Admitting: Family Medicine

## 2012-09-02 DIAGNOSIS — M25511 Pain in right shoulder: Secondary | ICD-10-CM

## 2012-09-02 DIAGNOSIS — M19019 Primary osteoarthritis, unspecified shoulder: Secondary | ICD-10-CM | POA: Insufficient documentation

## 2012-09-02 DIAGNOSIS — M25519 Pain in unspecified shoulder: Secondary | ICD-10-CM | POA: Insufficient documentation

## 2014-03-27 HISTORY — PX: HEMORRHOID BANDING: SHX5850

## 2014-08-04 ENCOUNTER — Encounter: Payer: Self-pay | Admitting: Internal Medicine

## 2014-08-21 ENCOUNTER — Ambulatory Visit (INDEPENDENT_AMBULATORY_CARE_PROVIDER_SITE_OTHER): Payer: Medicare Other | Admitting: Gastroenterology

## 2014-08-21 ENCOUNTER — Encounter (INDEPENDENT_AMBULATORY_CARE_PROVIDER_SITE_OTHER): Payer: Self-pay

## 2014-08-21 ENCOUNTER — Encounter: Payer: Self-pay | Admitting: Gastroenterology

## 2014-08-21 VITALS — BP 146/71 | HR 73 | Temp 96.9°F | Ht 69.0 in | Wt 174.0 lb

## 2014-08-21 DIAGNOSIS — K648 Other hemorrhoids: Secondary | ICD-10-CM | POA: Insufficient documentation

## 2014-08-21 MED ORDER — HYDROCORTISONE 2.5 % RE CREA: 1.0000 "application " | TOPICAL_CREAM | Freq: Two times a day (BID) | RECTAL | Status: DC

## 2014-08-21 NOTE — Patient Instructions (Signed)
You have internal hemorrhoids that prolapse out. Use the Anusol cream twice a day per rectum for 7 days.   I recommend taking the metamucil every day and continue the probiotic.  I am requesting the reports from your last colonoscopy.  We will see you in 4-6 weeks!

## 2014-08-21 NOTE — Progress Notes (Signed)
Primary Care Physician:  Cassell SmilesFUSCO,LAWRENCE J., MD Primary Gastroenterologist:  Dr. Jena Gaussourk   Chief Complaint  Patient presents with  . Rectal Bleeding    HPI:   Jacob Sullivan is a 81 y.o. male presenting today at the request of Dr. Sherwood GamblerFusco secondary to rectal bleeding.   Notes paper hematochezia. States one of his hemorrhoids ruptured, has a little hemorrhoid tag now and sometimes a "little ball" that if it comes down has to "push back". Large amounts of gas. Feels like he has to have a BM but it's just gas. Denies constipation or diarrhea. Feels like he isn't emptying completely though. Right after eats has to go to bathroom. Sometimes just a drop or two of stool but sometimes a real good one in the morning. Years ago only had BM twice a day. Affecting his golf game. Takes Metamucil just occasionally. Buys a box of raisin bran sometimes and eats out of the box. States [facility]. Last paper hematochezia about a month ago, no rectal discomfort.   Records received after patient left, with last colonoscopy in Nov 2010 by Dr. Karilyn Cotaehman noting prolapsing internal hemorrhoids. Few small diverticula at sigmoid and ascending colon.   Past Medical History  Diagnosis Date  . Irregular heartbeat   . Hypertension   . Diabetes   . Osteoarthritis   . Chronic kidney disease   . BPH (benign prostatic hyperplasia)     Past Surgical History  Procedure Laterality Date  . Medial partial knee replacement    . Rotator cuff repair    . Inguinal hernia repair    . Colonoscopy  Nov 2010    Dr. Karilyn Cotaehman: prolapsing internal hemorrhoids, few small diverticula at sigmoid and ascending colon    Current Outpatient Prescriptions  Medication Sig Dispense Refill  . acetaminophen (TYLENOL) 325 MG tablet Take 2 tablets (650 mg total) by mouth every 6 (six) hours as needed (or Fever >/= 101).    Marland Kitchen. amLODipine (NORVASC) 5 MG tablet Take 5 mg by mouth daily.    Marland Kitchen. aspirin EC  81 MG tablet Take 81 mg by mouth daily.    . cetirizine (ZYRTEC) 10 MG tablet Take 10 mg by mouth daily.    Marland Kitchen. losartan-hydrochlorothiazide (HYZAAR) 100-25 MG per tablet Take 1 tablet by mouth daily.    . Multiple Vitamin (MULTIVITAMIN WITH MINERALS) TABS Take 1 tablet by mouth daily.    . NON FORMULARY Vitamin D3  1000   One daily    . psyllium (METAMUCIL) 58.6 % powder Take 1 packet by mouth daily. As needed    . simethicone (MYLICON) 125 MG chewable tablet Chew 125 mg by mouth every 6 (six) hours as needed. gas    . Tamsulosin HCl (FLOMAX) 0.4 MG CAPS Take 1 capsule (0.4 mg total) by mouth at bedtime. 30 capsule 0  . vitamin C (ASCORBIC ACID) 500 MG tablet Take 500 mg by mouth daily.    . Garlic 1000 MG CAPS Take 1,000 mg by mouth daily. patient states he takes every once in a while    . hydrocortisone (ANUSOL-HC) 2.5 % rectal cream Place 1 application rectally 2 (two) times daily. 30 g 1  . vitamin B-12 (CYANOCOBALAMIN) 1000 MCG tablet Take 1,000 mcg by mouth daily as needed. patient does not take on a regular basis     No current facility-administered medications for this visit.    Allergies as of 08/21/2014  . (  No Known Allergies)    Family History  Problem Relation Age of Onset  . Colon cancer Neg Hx     History   Social History  . Marital Status: Married    Spouse Name: N/A  . Number of Children: N/A  . Years of Education: N/A   Occupational History  . Not on file.   Social History Main Topics  . Smoking status: Never Smoker   . Smokeless tobacco: Not on file     Comment: Never smoked for any length of time  . Alcohol Use: No  . Drug Use: No  . Sexual Activity: Not on file   Other Topics Concern  . Not on file   Social History Narrative    Review of Systems: Gen: Denies any fever, chills, fatigue, weight loss, lack of appetite.  CV: Denies chest pain, heart palpitations, peripheral edema, syncope.  Resp: Denies shortness of breath at rest or with exertion.  Denies wheezing or cough.  GI: see HPI GU : +urinary frequency MS: knee pain occasionally  Derm: Denies rash, itching, dry skin Psych: Denies depression, anxiety, memory loss, and confusion Heme: Denies bruising, bleeding, and enlarged lymph nodes.  Physical Exam: BP 146/71 mmHg  Pulse 73  Temp(Src) 96.9 F (36.1 C) (Oral)  Ht  (1.753 m)  Wt 174 lb (78.926 kg)  BMI 25.68 kg/m2 General:   Alert and oriented. Pleasant and cooperative. Well-nourished and well-developed.  Head:  Normocephalic and atraumatic. Eyes:  Without icterus, sclera clear and conjunctiva pink.  Ears:  Normal auditory acuity. Nose:  No deformity, discharge,  or lesions. Mouth:  No deformity or lesions, oral mucosa pink.  Lungs:  Clear to auscultation bilaterally. No wheezes, rales, or rhonchi. No distress.  Heart:  S1, S2 present without murmurs appreciated.  Abdomen:  +BS, soft, non-tender and non-distended. No HSM noted. No guarding or rebound. No masses appreciated.  Rectal:  Grade 2-3 prolapsing internal hemorrhoids, easily reducible, no obvious mass noted on internal exam Msk:  Symmetrical without gross deformities. Normal posture. Extremities:  Without  edema. Neurologic:  Alert and  oriented x4;  grossly normal neurologically. Psych:  Alert and cooperative. Normal mood and affect.

## 2014-08-31 ENCOUNTER — Encounter: Payer: Self-pay | Admitting: Gastroenterology

## 2014-08-31 NOTE — Assessment & Plan Note (Signed)
81 year old male with low-volume hematochezia likely secondary to intermittently prolapsing internal hemorrhoids. Rectal exam with grade 2-3 internal hemorrhoids, easily reducible. After patient left, last colonoscopy reports were received noting prolapsing internal hemorrhoids in 2010. Will provide Metamucil daily, continue probiotic, short course of Anusol, and return in 4-6 weeks for assessment. May need repeat colonoscopy to exclude other occult etiology to rectal bleeding, but it is highly likely this is emanating from known hemorrhoids. May not be a candidate for banding depending on severity of hemorrhoids. Further recommendations to follow at next appt.

## 2014-09-07 NOTE — Progress Notes (Signed)
cc'ed to pcp °

## 2014-10-07 ENCOUNTER — Encounter: Payer: Self-pay | Admitting: Gastroenterology

## 2014-10-07 ENCOUNTER — Ambulatory Visit (INDEPENDENT_AMBULATORY_CARE_PROVIDER_SITE_OTHER): Payer: Medicare Other | Admitting: Gastroenterology

## 2014-10-07 VITALS — BP 148/77 | HR 69 | Temp 98.4°F | Ht 69.0 in | Wt 174.2 lb

## 2014-10-07 DIAGNOSIS — K648 Other hemorrhoids: Secondary | ICD-10-CM

## 2014-10-07 NOTE — Assessment & Plan Note (Signed)
Scant, pinpoint blood on tissue with wiping intermittently, without rectal pain or discomfort. Known intermittent prolapsing hemorrhoids. Last colonoscopy in 2010 with prolapsing internal hemorrhoids. Doubt bleeding is from occult malignancy and likely secondary to known hemorrhoids. Discussed at length with patient considering updated colonoscopy with subsequent banding, but he is hesitant to pursue any procedures at all, including banding. Therefore, we will treat symptomatically, supportive care. Start Miralax every evening to every other evening, continue Metamucil and probiotic, and return in 6 weeks.

## 2014-10-07 NOTE — Progress Notes (Signed)
Referring Provider: Elfredia Nevins, MD Primary Care Physician:  Cassell Smiles., MD Primary GI: Dr. Jena Gauss   Chief Complaint  Patient presents with  . Follow-up    HPI:   Jacob Sullivan is a 81 y.o. male presenting today with a history of hemorrhoids, with last visit in May 2016. Exam at that time with grade 2-3 internal hemorrhoids that were easily reducible. Last colonoscopy in 2010 by Dr. Karilyn Cota with prolapsing internal hemorrhoids, few small diverticula at sigmoid and ascending colon.    Doing Metamucil daily. Probiotic, Only a tiny, scant spot of blood at times. None in stool or in toilet. Unable to use the anusol cream effectively, stating the applicator was too small. Suppositories too expensive.  .  No rectal pain. No constipation. BM about twice per day. Sometimes BM will be hard, sometimes not productive.    Past Medical History  Diagnosis Date  . Irregular heartbeat   . Hypertension   . Diabetes   . Osteoarthritis   . Chronic kidney disease   . BPH (benign prostatic hyperplasia)     Past Surgical History  Procedure Laterality Date  . Medial partial knee replacement    . Rotator cuff repair    . Inguinal hernia repair    . Colonoscopy  Nov 2010    Dr. Karilyn Cota: prolapsing internal hemorrhoids, few small diverticula at sigmoid and ascending colon    Current Outpatient Prescriptions  Medication Sig Dispense Refill  . acetaminophen (TYLENOL) 325 MG tablet Take 2 tablets (650 mg total) by mouth every 6 (six) hours as needed (or Fever >/= 101).    Marland Kitchen amLODipine (NORVASC) 5 MG tablet Take 5 mg by mouth daily.    Marland Kitchen aspirin EC 81 MG tablet Take 81 mg by mouth daily.    . cetirizine (ZYRTEC) 10 MG tablet Take 10 mg by mouth daily.    . Garlic 1000 MG CAPS Take 1,000 mg by mouth daily. patient states he takes every once in a while    . hydrocortisone (ANUSOL-HC) 2.5 % rectal cream Place 1 application rectally 2 (two) times daily. 30 g 1  . losartan-hydrochlorothiazide  (HYZAAR) 100-25 MG per tablet Take 1 tablet by mouth daily.    . Multiple Vitamin (MULTIVITAMIN WITH MINERALS) TABS Take 1 tablet by mouth daily.    . NON FORMULARY Vitamin D3  1000   One daily    . psyllium (METAMUCIL) 58.6 % powder Take 1 packet by mouth daily. As needed    . simethicone (MYLICON) 125 MG chewable tablet Chew 125 mg by mouth every 6 (six) hours as needed. gas    . vitamin B-12 (CYANOCOBALAMIN) 1000 MCG tablet Take 1,000 mcg by mouth daily as needed. patient does not take on a regular basis    . vitamin C (ASCORBIC ACID) 500 MG tablet Take 500 mg by mouth daily.    . Tamsulosin HCl (FLOMAX) 0.4 MG CAPS Take 1 capsule (0.4 mg total) by mouth at bedtime. (Patient not taking: Reported on 10/07/2014) 30 capsule 0   No current facility-administered medications for this visit.    Allergies as of 10/07/2014  . (No Known Allergies)    Family History  Problem Relation Age of Onset  . Colon cancer Neg Hx     History   Social History  . Marital Status: Married    Spouse Name: N/A  . Number of Children: N/A  . Years of Education: N/A   Social History Main Topics  . Smoking  status: Never Smoker   . Smokeless tobacco: Not on file     Comment: Never smoked for any length of time  . Alcohol Use: No  . Drug Use: No  . Sexual Activity: Not on file   Other Topics Concern  . None   Social History Narrative    Review of Systems: As mentioned in HPI  Physical Exam: BP 148/77 mmHg  Pulse 69  Temp(Src) 98.4 F (36.9 C) (Oral)  Ht  (1.753 m)  Wt 174 lb 3.2 oz (79.017 kg)  BMI 25.71 kg/m2 General:   Alert and oriented. No distress noted. Pleasant and cooperative.  Head:  Normocephalic and atraumatic. Abdomen:  +BS, soft, non-tender and non-distended. No rebound or guarding. No HSM or masses noted. Msk:  Symmetrical without gross deformities. Normal posture. Extremities:  Without edema. Neurologic:  Alert and  oriented x4;  grossly normal neurologically. Psych:   Alert and cooperative. Normal mood and affect.

## 2014-10-07 NOTE — Patient Instructions (Signed)
You may take Miralax once each evening to every other evening, as you see best.   I would like to see you in 6 weeks!

## 2014-10-12 NOTE — Progress Notes (Signed)
cc'ed to pcp °

## 2014-11-18 ENCOUNTER — Encounter: Payer: Self-pay | Admitting: Gastroenterology

## 2014-11-18 ENCOUNTER — Ambulatory Visit (INDEPENDENT_AMBULATORY_CARE_PROVIDER_SITE_OTHER): Payer: Medicare Other | Admitting: Gastroenterology

## 2014-11-18 VITALS — BP 138/75 | HR 64 | Temp 97.2°F | Ht 69.0 in | Wt 171.0 lb

## 2014-11-18 DIAGNOSIS — K648 Other hemorrhoids: Secondary | ICD-10-CM

## 2014-11-18 NOTE — Progress Notes (Signed)
Referring Provider: Elfredia Nevins, MD Primary Care Physician:  Cassell Smiles., MD  Primary GI: Dr. Jena Gauss   Chief Complaint  Patient presents with  . Follow-up    HPI:   Jacob Sullivan is a 81 y.o. male presenting today with a history of hemorrhoids, with last visit in July2016. Prior exams  with grade 2-3 internal hemorrhoids that were easily reducible. Last colonoscopy in 2010 by Dr. Karilyn Cota with prolapsing internal hemorrhoids, few small diverticula at sigmoid and ascending colon.   Taking Metamucil and probiotic. Only taking Metamucil once per day. Miralax here and there. Only as needed. Trying to keep from straining.  Sometimes hard to get bowel movements to move. Feels like BMs may not be productive when going, as he goes multiple times during the day.   Declining Amitiza. Occasional blood with wiping, just a "spot". NO itching, burning, rectal pain.   Past Medical History  Diagnosis Date  . Irregular heartbeat   . Hypertension   . Diabetes   . Osteoarthritis   . Chronic kidney disease   . BPH (benign prostatic hyperplasia)     Past Surgical History  Procedure Laterality Date  . Medial partial knee replacement    . Rotator cuff repair    . Inguinal hernia repair    . Colonoscopy  Nov 2010    Dr. Karilyn Cota: prolapsing internal hemorrhoids, few small diverticula at sigmoid and ascending colon    Current Outpatient Prescriptions  Medication Sig Dispense Refill  . acetaminophen (TYLENOL) 325 MG tablet Take 2 tablets (650 mg total) by mouth every 6 (six) hours as needed (or Fever >/= 101).    Marland Kitchen amLODipine (NORVASC) 5 MG tablet Take 5 mg by mouth daily.    Marland Kitchen aspirin EC 81 MG tablet Take 81 mg by mouth daily.    . cetirizine (ZYRTEC) 10 MG tablet Take 10 mg by mouth daily.    . Garlic 1000 MG CAPS Take 1,000 mg by mouth daily. patient states he takes every once in a while    . hydrocortisone (ANUSOL-HC) 2.5 % rectal cream Place 1 application rectally 2 (two) times daily.  30 g 1  . losartan-hydrochlorothiazide (HYZAAR) 100-25 MG per tablet Take 1 tablet by mouth daily.    . Multiple Vitamin (MULTIVITAMIN WITH MINERALS) TABS Take 1 tablet by mouth daily.    . NON FORMULARY Vitamin D3  1000   One daily    . psyllium (METAMUCIL) 58.6 % powder Take 1 packet by mouth daily. As needed    . simethicone (MYLICON) 125 MG chewable tablet Chew 125 mg by mouth every 6 (six) hours as needed. gas    . vitamin B-12 (CYANOCOBALAMIN) 1000 MCG tablet Take 1,000 mcg by mouth daily as needed. patient does not take on a regular basis    . vitamin C (ASCORBIC ACID) 500 MG tablet Take 500 mg by mouth daily.    . Tamsulosin HCl (FLOMAX) 0.4 MG CAPS Take 1 capsule (0.4 mg total) by mouth at bedtime. (Patient not taking: Reported on 11/18/2014) 30 capsule 0   No current facility-administered medications for this visit.    Allergies as of 11/18/2014  . (No Known Allergies)    Family History  Problem Relation Age of Onset  . Colon cancer Neg Hx     Social History   Social History  . Marital Status: Married    Spouse Name: N/A  . Number of Children: N/A  . Years of Education: N/A   Social  History Main Topics  . Smoking status: Never Smoker   . Smokeless tobacco: None     Comment: Never smoked for any length of time  . Alcohol Use: No  . Drug Use: No  . Sexual Activity: Not Asked   Other Topics Concern  . None   Social History Narrative    Review of Systems: Negative unless mentioned in HPI  Physical Exam: BP 138/75 mmHg  Pulse 64  Temp(Src) 97.2 F (36.2 C) (Oral)  Ht  (1.753 m)  Wt 171 lb (77.565 kg)  BMI 25.24 kg/m2 General:   Alert and oriented. No distress noted. Pleasant and cooperative.  Head:  Normocephalic and atraumatic. Eyes:  Conjuctiva clear without scleral icterus. Mouth:  Oral mucosa pink and moist. Good dentition. No lesions. Abdomen:  +BS, soft, non-tender and non-distended. No rebound or guarding. No HSM or masses noted. Msk:   Symmetrical without gross deformities. Normal posture. Extremities:  Without edema. Neurologic:  Alert and  oriented x4;  grossly normal neurologically. Psych:  Alert and cooperative. Normal mood and affect.

## 2014-11-18 NOTE — Patient Instructions (Signed)
Increase Metamucil to twice a day.   I would like to see you back in October 2016 to set you up for a colonoscopy.

## 2014-11-25 NOTE — Assessment & Plan Note (Signed)
Scant paper hematochezia intermittently in setting of known prolapsing hemorrhoids with last colonoscopy in 2010. Doubt rectal bleeding secondary to occult malignancy but updated colonoscopy with subsequent banding should be considered. Needs to increase fiber to twice a day, Miralax prn. Declining Amitiza. Return in October to discuss colonoscopy.

## 2014-11-26 NOTE — Progress Notes (Signed)
CC'ED TO PCP 

## 2015-01-20 ENCOUNTER — Ambulatory Visit (INDEPENDENT_AMBULATORY_CARE_PROVIDER_SITE_OTHER): Payer: Medicare Other | Admitting: Gastroenterology

## 2015-01-20 ENCOUNTER — Encounter: Payer: Self-pay | Admitting: Gastroenterology

## 2015-01-20 VITALS — BP 144/85 | HR 91 | Temp 97.0°F | Ht 69.0 in | Wt 174.4 lb

## 2015-01-20 DIAGNOSIS — K5900 Constipation, unspecified: Secondary | ICD-10-CM

## 2015-01-20 DIAGNOSIS — K648 Other hemorrhoids: Secondary | ICD-10-CM | POA: Diagnosis not present

## 2015-01-20 NOTE — Progress Notes (Signed)
Referring Provider: Elfredia NevinsFusco, Lawrence, MD Primary Care Physician:  Cassell SmilesFUSCO,LAWRENCE J., MD  Primary GI: Dr. Jena Gaussourk   Chief Complaint  Patient presents with  . Follow-up    having alot of gas, multiple bm's a day    HPI:   Jacob Sullivan is a 81 y.o. male presenting today with a history of hemorrhoids, with last visit in August 2016. Prior exams with grade 2-3 internal hemorrhoids that were easily reducible. Last colonoscopy in 2010 by Dr. Karilyn Cotaehman with prolapsing internal hemorrhoids, few small diverticula at sigmoid and ascending colon. Occasional low-volume hematochezia. Has had multiple BMs a day in the past but in past stated was non-productive. Has declined Amitiza in the past, which was recommended due to possible underlying constipation. Increased fiber to twice a day and Miralax prn at last visit. Here to discuss colonoscopy.   Has appointment with Dr. Sherwood GamblerFusco tomorrow and would like to talk with him first before scheduling a colonoscopy. States he is having a BM "too often". Loves to play golf but hasn't been in over a year because right after eating, he has to have a BM. Has gone three times already this morning. Postprandial component. Bristol scale #5. Has a lot of gas. Doesn't know if he is going to have an "accident" or not. Has hemorrhoids he has to "push back up". Occasional scant tissue hematochezia. Feels like it's not productive. When he first has the BMs, it comes out easily, then he sits there for awhile and waits for more to come out. No abdominal pain. Taking just one Metamucil a day. Not taking Miralax. Takes a probiotic.   Past Medical History  Diagnosis Date  . Irregular heartbeat   . Hypertension   . Diabetes (HCC)   . Osteoarthritis   . Chronic kidney disease   . BPH (benign prostatic hyperplasia)     Past Surgical History  Procedure Laterality Date  . Medial partial knee replacement    . Rotator cuff repair    . Inguinal hernia repair    . Colonoscopy  Nov 2010     Dr. Karilyn Cotaehman: prolapsing internal hemorrhoids, few small diverticula at sigmoid and ascending colon    Current Outpatient Prescriptions  Medication Sig Dispense Refill  . aspirin EC 81 MG tablet Take 81 mg by mouth daily.    . cetirizine (ZYRTEC) 10 MG tablet Take 10 mg by mouth daily.    . Multiple Vitamin (MULTIVITAMIN WITH MINERALS) TABS Take 1 tablet by mouth daily.    . vitamin C (ASCORBIC ACID) 500 MG tablet Take 500 mg by mouth daily.    Marland Kitchen. acetaminophen (TYLENOL) 325 MG tablet Take 2 tablets (650 mg total) by mouth every 6 (six) hours as needed (or Fever >/= 101).    Marland Kitchen. amLODipine (NORVASC) 5 MG tablet Take 5 mg by mouth daily.    . Garlic 1000 MG CAPS Take 1,000 mg by mouth daily. patient states he takes every once in a while    . hydrocortisone (ANUSOL-HC) 2.5 % rectal cream Place 1 application rectally 2 (two) times daily. 30 g 1  . losartan-hydrochlorothiazide (HYZAAR) 100-25 MG per tablet Take 1 tablet by mouth daily.    . NON FORMULARY Vitamin D3  1000   One daily    . psyllium (METAMUCIL) 58.6 % powder Take 1 packet by mouth daily. As needed    . simethicone (MYLICON) 125 MG chewable tablet Chew 125 mg by mouth every 6 (six) hours as needed. gas    .  Tamsulosin HCl (FLOMAX) 0.4 MG CAPS Take 1 capsule (0.4 mg total) by mouth at bedtime. (Patient not taking: Reported on 01/20/2015) 30 capsule 0  . vitamin B-12 (CYANOCOBALAMIN) 1000 MCG tablet Take 1,000 mcg by mouth daily as needed. patient does not take on a regular basis     No current facility-administered medications for this visit.    Allergies as of 01/20/2015  . (No Known Allergies)    Family History  Problem Relation Age of Onset  . Colon cancer Neg Hx     Social History   Social History  . Marital Status: Married    Spouse Name: N/A  . Number of Children: N/A  . Years of Education: N/A   Social History Main Topics  . Smoking status: Never Smoker   . Smokeless tobacco: None     Comment: Never smoked for  any length of time  . Alcohol Use: No  . Drug Use: No  . Sexual Activity: Not Asked   Other Topics Concern  . None   Social History Narrative    Review of Systems: As mentioned in HPI   Physical Exam: BP 144/85 mmHg  Pulse 91  Temp(Src) 97 F (36.1 C) (Oral)  Ht  (1.753 m)  Wt 174 lb 6.4 oz (79.107 kg)  BMI 25.74 kg/m2 General:   Alert and oriented. No distress noted. Pleasant and cooperative.  Head:  Normocephalic and atraumatic. Eyes:  Conjuctiva clear without scleral icterus. Heart:  S1, S2 present without murmurs, rubs, or gallops. Regular rate and rhythm. Abdomen:  +BS, soft, non-tender and non-distended. No rebound or guarding. No HSM or masses noted. Msk:  Symmetrical without gross deformities. Normal posture. Extremities:  Without edema. Neurologic:  Alert and  oriented x4;  grossly normal neurologically. Psych:  Alert and cooperative. Normal mood and affect.

## 2015-01-20 NOTE — Patient Instructions (Signed)
Please increase Metamucil to twice a day.   Please call me after you see Dr. Sherwood GamblerFusco. I recommend a colonoscopy.   We will see you in 3 months!

## 2015-01-20 NOTE — Assessment & Plan Note (Signed)
81 year old male with history of known intermittently prolapsing hemorrhoids with last colonoscopy in 2010. Scant paper hematochezia likely secondary to known hemorrhoids but unable to exclude occult malignancy (although less likely). Continues to report bowel habits issues, which by description seem to be multiple, small, unproductive BMs and not truly diarrhea. I have asked him to increase his fiber intake, continue probiotic, and I have recommended a colonoscopy on multiple occasions. He has been hesitant to do this. He will talk with Dr. Sherwood GamblerFusco and contact our office with his decision. Return in 3 months.

## 2015-01-20 NOTE — Progress Notes (Signed)
cc'ed to pcp °

## 2015-02-16 ENCOUNTER — Telehealth: Payer: Self-pay | Admitting: Internal Medicine

## 2015-02-16 NOTE — Telephone Encounter (Signed)
[phone]  PT STATED THAT ANNA HAD WANTED HIM TO CALL HERE AFTER HE SPOKE TO HIS PCP ABOUT A TCS.  HIS PCP TOLD HIM THAT AT HIS AGE HE DID NOT KNOW WHY HE SHOULD HAVE TO HAVE ONE.   PLEASE ADVISE  CELL [phone]

## 2015-02-16 NOTE — Telephone Encounter (Signed)
Routing to Anna

## 2015-02-17 NOTE — Telephone Encounter (Signed)
Noted. He has had issues with increased frequency of BMs and rectal bleeding. Would probably benefit from hemorrhoid banding but it would be good to do a colonoscopy to ensure no occult issues going on. I don't have much more to offer him at this point.

## 2015-02-25 NOTE — Telephone Encounter (Signed)
Pt is aware. Per AS pt should come in and talk with Dr.Rourk about it.   Stacey, please schedule ov with RMR.

## 2015-02-25 NOTE — Telephone Encounter (Signed)
I MOVED HIM TO A December APPT WITH RMR

## 2015-03-16 ENCOUNTER — Encounter: Payer: Self-pay | Admitting: Internal Medicine

## 2015-03-16 ENCOUNTER — Ambulatory Visit (INDEPENDENT_AMBULATORY_CARE_PROVIDER_SITE_OTHER): Payer: Medicare Other | Admitting: Internal Medicine

## 2015-03-16 VITALS — BP 148/83 | HR 80 | Temp 96.2°F | Ht 69.0 in | Wt 171.6 lb

## 2015-03-16 DIAGNOSIS — K648 Other hemorrhoids: Secondary | ICD-10-CM

## 2015-03-16 NOTE — Patient Instructions (Signed)
Avoid straining.  Continue Metamucil twice daily  Limit toilet time to 2-5 minutes  Call with any interim problems  Schedule followup appointment in 3 weeks from now

## 2015-03-16 NOTE — Progress Notes (Signed)
[facility] banding procedure note:  The patient presents with symptomatic grade 3/4  hemorrhoids, unresponsive to maximal medical therapy; requesting rubber band ligation of his hemorrhoidal disease. All risks, benefits, and alternative forms of therapy were described and informed consent was obtained.  Last colonoscopy 2010. Patient hasn't had any bleeding recently.  In the left lateral decubitus position a digital rectal exam and external perianal inspection demonstrated a prominent grade 3 hemorrhoid tissue. There was one of grade 4 chronically thrombosed tag.; Anoscopy performed. Most prominent hemorrhoid originating from the left lateral side.   I discussed the band placement with the patient. I told him that I could not guarantee him a 100% resolution however I believe we could improve on this situation. Grade 4 tag may not pull backup  The decision was made to band the left lateral internal hemorrhoid, and the [facility] O'Regan System was used to perform band ligation without complication. Digital anorectal examination was then performed to assure proper positioning of the band, and to adjust the banded tissue as required. The patient was discharged home without pain or other issues. Dietary and behavioral recommendations were given  along with follow-up instructions. The patient will return in 3 for followup and possible additional banding as required.  No complications were encountered and the patient tolerated the procedure well.

## 2015-04-13 ENCOUNTER — Ambulatory Visit (INDEPENDENT_AMBULATORY_CARE_PROVIDER_SITE_OTHER): Payer: Medicare Other | Admitting: Internal Medicine

## 2015-04-13 ENCOUNTER — Encounter: Payer: Self-pay | Admitting: Internal Medicine

## 2015-04-13 VITALS — BP 137/70 | HR 83 | Temp 97.8°F | Ht 69.0 in | Wt 156.6 lb

## 2015-04-13 DIAGNOSIS — K648 Other hemorrhoids: Secondary | ICD-10-CM

## 2015-04-13 NOTE — Patient Instructions (Signed)
Avoid straining.  Continue Metamucil daily  Limit toilet time to 2-5 minutes  Call with any interim problems  Schedule followup appointment in 4 weeks from now

## 2015-04-13 NOTE — Progress Notes (Signed)
[facility] banding procedure note:  The patient presents with symptomatic grade 3/4* hemorrhoids;  Underwent banding of the left lateral hemorrhoid on 03/16/2015. Has a chronically thrombosed grade 4 hemorrhoid. 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 He thinks he has another area of hemorrhoid which is protruding. He doesn't strain to have a bowel movement - but strains significantly at other times during his daily routine. He does take Metamucil daily. He requests additional band placement today. Again, I told him that I would be doubtful that banding would take care of all of his hemorrhoid issues with 100% certainty-particularly the thrombosed hemorrhoid. But overall, I feel banding will help. He had no difficulties with the prior band placement.    All risks, benefits, and alternative forms of therapy were described and informed consent was obtained.  In the left lateral decubitus position, DRE revealed prolapsing grade 3 hemorrhage. Again, chronically thrombosed hemorrhoid noted. However, all of his hemorrhoid tissue was reducible today.  The decision was made to band the right anterior internal hemorrhoid:the [facility] O'Regan System was used to perform band ligation without complication. Digital anorectal examination was then performed to assure proper positioning of the band;  Been felt to be in excellent position. No pinching or pain. The patient was discharged home without pain or other issues. Dietary and behavioral recommendations were given along with follow-up instructions. The patient will return in 4 weeks for followup and possible additional banding as required.  No complications were encountered and the patient tolerated the procedure well.

## 2015-04-22 ENCOUNTER — Ambulatory Visit: Payer: Medicare Other | Admitting: Gastroenterology

## 2015-05-04 DIAGNOSIS — E663 Overweight: Secondary | ICD-10-CM | POA: Diagnosis not present

## 2015-05-04 DIAGNOSIS — Z6827 Body mass index (BMI) 27.0-27.9, adult: Secondary | ICD-10-CM | POA: Diagnosis not present

## 2015-05-04 DIAGNOSIS — M1991 Primary osteoarthritis, unspecified site: Secondary | ICD-10-CM | POA: Diagnosis not present

## 2015-05-04 DIAGNOSIS — Z1389 Encounter for screening for other disorder: Secondary | ICD-10-CM | POA: Diagnosis not present

## 2015-05-04 DIAGNOSIS — N182 Chronic kidney disease, stage 2 (mild): Secondary | ICD-10-CM | POA: Diagnosis not present

## 2015-05-04 DIAGNOSIS — E1129 Type 2 diabetes mellitus with other diabetic kidney complication: Secondary | ICD-10-CM | POA: Diagnosis not present

## 2015-05-04 DIAGNOSIS — Z0000 Encounter for general adult medical examination without abnormal findings: Secondary | ICD-10-CM | POA: Diagnosis not present

## 2015-05-14 ENCOUNTER — Encounter: Payer: Self-pay | Admitting: Internal Medicine

## 2015-05-14 ENCOUNTER — Ambulatory Visit (INDEPENDENT_AMBULATORY_CARE_PROVIDER_SITE_OTHER): Payer: Medicare Other | Admitting: Internal Medicine

## 2015-05-14 VITALS — BP 123/76 | HR 83 | Temp 97.6°F | Ht 69.0 in | Wt 170.6 lb

## 2015-05-14 DIAGNOSIS — K648 Other hemorrhoids: Secondary | ICD-10-CM | POA: Diagnosis not present

## 2015-05-14 NOTE — Patient Instructions (Signed)
Avoid straining.  Continue psyllium twice daily  Limit toilet time to 2-5 minutes  Call with any interim problems  Schedule followup appointment in 3 months from now

## 2015-05-14 NOTE — Progress Notes (Signed)
[facility] banding procedure note:  The patient presents with symptomatic grade 3/4 hemorrhoids; patient status post prior banding of the right anterior and left lateral columns. At last exam, hemorrhoids were all reducible. Chronically thrombosed hemorrhoid present. States intermittent bleeding persists but not having any other issues at this time. Takes Metamucil regularly does have some bloating on occasion. He desires a third band be placed. All risks, benefits, and alternative forms of therapy were described and informed consent was obtained.  In the left lateral decubitus position, DRE reveals a little effacement of the anorectum diffusely with chronically thrombosed hemorrhoid again appreciated.  The decision was made to band the right posterior internal hemorrhoid; the [facility] O'Regan System was used to perform band ligation without complication. Digital anorectal examination was then performed to assure proper positioning of the band, band found to be in excellent position. No pinching or pain. The patient was discharged home without pain or other issues. Dietary and behavioral recommendations were given. The patient will return in 3 months ofor follow-up.  No complications were encountered and the patient tolerated the procedure well.

## 2015-06-10 ENCOUNTER — Other Ambulatory Visit: Payer: Self-pay

## 2015-06-15 ENCOUNTER — Encounter: Payer: Self-pay | Admitting: Internal Medicine

## 2015-06-15 ENCOUNTER — Ambulatory Visit (INDEPENDENT_AMBULATORY_CARE_PROVIDER_SITE_OTHER): Payer: Medicare Other | Admitting: Internal Medicine

## 2015-06-15 VITALS — BP 127/66 | HR 78 | Temp 97.2°F | Ht 69.0 in | Wt 168.0 lb

## 2015-06-15 DIAGNOSIS — K645 Perianal venous thrombosis: Secondary | ICD-10-CM

## 2015-06-15 DIAGNOSIS — K648 Other hemorrhoids: Secondary | ICD-10-CM

## 2015-06-15 NOTE — Progress Notes (Signed)
[facility] banding procedure note:  Patient chronically thrombosed hemorrhoid. He's had all 3 columns banded previously was doing well until recently when he noted recurrence of low hemorrhoidal protrusion feels that he needs another band placed.  Patient denies bleeding.  Rectal exam demonstrates some effacement of the rectal mucosa. Chronically thrombosed hemorrhoid at the 6:00 position. Absolutely nontender digital exam reveals no masses.  Is somewhat lax sphincter tone. This redundant external tissue is loosely reducible digitally.  I elected to place a band in the neutral position as well as in the left lateral position. Follow-up digital rectal exam revealed adequate placement. No pinching or pain after deployment.  The patient is to follow what discharge instructions carefully. I told him that he continues to have significant symptoms, he may ultimately need to see the general surgeon. He may have some degree of rectal prolapse which may be contributing to the clinical situation.

## 2015-06-15 NOTE — Patient Instructions (Signed)
Avoid straining.  Benefiber 2 teaspoons twice daily  Limit toilet time to 2-5 minutes  Call with any interim problems  Schedule followup appointment in 1 month from now  If your symptoms do not settle down, it is possible you may still need to see the surgeon.

## 2015-07-13 ENCOUNTER — Ambulatory Visit (INDEPENDENT_AMBULATORY_CARE_PROVIDER_SITE_OTHER): Payer: Medicare Other | Admitting: Internal Medicine

## 2015-07-13 ENCOUNTER — Encounter: Payer: Self-pay | Admitting: Internal Medicine

## 2015-07-13 VITALS — BP 146/76 | HR 67 | Temp 96.6°F | Ht 67.0 in | Wt 171.4 lb

## 2015-07-13 DIAGNOSIS — K648 Other hemorrhoids: Secondary | ICD-10-CM | POA: Diagnosis not present

## 2015-07-13 DIAGNOSIS — K5900 Constipation, unspecified: Secondary | ICD-10-CM | POA: Diagnosis not present

## 2015-07-13 NOTE — Progress Notes (Signed)
Primary Care Physician:  Cassell Smiles., MD Primary Gastroenterologist:  Dr. Jena Gauss  Pre-Procedure History & Physical: HPI:  Jacob Sullivan is a 82 y.o. male here for follow-up constipation hemorrhoids. Status post 3 banding sessions. States no longer bleeding itching or burning. Big issue is constipation there are times he may go 3 days without a bowel movement. Doesn't take MiraLax works. Takes psyllium twice daily.  Last complete imaging of his lower GI tract the colonoscopy was 2010.  Past Medical History  Diagnosis Date  . Irregular heartbeat   . Hypertension   . Diabetes (HCC)   . Osteoarthritis   . Chronic kidney disease   . BPH (benign prostatic hyperplasia)   . Hemorrhoids     Past Surgical History  Procedure Laterality Date  . Medial partial knee replacement    . Rotator cuff repair    . Inguinal hernia repair    . Colonoscopy  Nov 2010    Dr. Karilyn Cota: prolapsing internal hemorrhoids, few small diverticula at sigmoid and ascending colon  . Hemorrhoid banding  2016    Dr.Chaundra Abreu    Prior to Admission medications   Medication Sig Start Date End Date Taking? Authorizing Provider  acetaminophen (TYLENOL) 325 MG tablet Take 2 tablets (650 mg total) by mouth every 6 (six) hours as needed (or Fever >/= 101). 03/15/12   Christiane Ha, MD  amLODipine (NORVASC) 5 MG tablet Take 5 mg by mouth daily.    Historical Provider, MD  aspirin EC 81 MG tablet Take 81 mg by mouth daily.    Historical Provider, MD  bisacodyl (DULCOLAX) 5 MG EC tablet Take 5 mg by mouth daily as needed for moderate constipation.    Historical Provider, MD  cetirizine (ZYRTEC) 10 MG tablet Take 10 mg by mouth daily.    Historical Provider, MD  Garlic 1000 MG CAPS Take 1,000 mg by mouth daily. patient states he takes every once in a while    Historical Provider, MD  hydrocortisone (ANUSOL-HC) 2.5 % rectal cream Place 1 application rectally 2 (two) times daily. 08/21/14   Nira Retort, NP    losartan-hydrochlorothiazide (HYZAAR) 100-25 MG per tablet Take 1 tablet by mouth daily.    Historical Provider, MD  Multiple Vitamin (MULTIVITAMIN WITH MINERALS) TABS Take 1 tablet by mouth daily.    Historical Provider, MD  NON FORMULARY Vitamin D3  1000   One daily    Historical Provider, MD  psyllium (METAMUCIL) 58.6 % powder Take 1 packet by mouth daily. As needed    Historical Provider, MD  simethicone (MYLICON) 125 MG chewable tablet Chew 125 mg by mouth every 6 (six) hours as needed. gas    Historical Provider, MD  Tamsulosin HCl (FLOMAX) 0.4 MG CAPS Take 1 capsule (0.4 mg total) by mouth at bedtime. 03/15/12   Christiane Ha, MD  vitamin B-12 (CYANOCOBALAMIN) 1000 MCG tablet Take 1,000 mcg by mouth daily as needed. patient does not take on a regular basis    Historical Provider, MD  vitamin C (ASCORBIC ACID) 500 MG tablet Take 500 mg by mouth daily.    Historical Provider, MD    Allergies as of 07/13/2015  . (No Known Allergies)    Family History  Problem Relation Age of Onset  . Colon cancer Neg Hx     Social History   Social History  . Marital Status: Married    Spouse Name: N/A  . Number of Children: N/A  . Years of Education:  N/A   Occupational History  . Not on file.   Social History Main Topics  . Smoking status: Never Smoker   . Smokeless tobacco: Not on file     Comment: Never smoked for any length of time  . Alcohol Use: No  . Drug Use: No  . Sexual Activity: Not on file   Other Topics Concern  . Not on file   Social History Narrative    Review of Systems: See HPI, otherwise negative ROS  Physical Exam: BP 146/76 mmHg  Pulse 67  Temp(Src) 96.6 F (35.9 C) (Oral)  Ht 5\' 7"  (1.702 m)  Wt 171 lb 6.4 oz (77.747 kg)  BMI 26.84 kg/m2 General:   Alert,  Well-developed, well-nourished, pleasant and cooperative in NAD Skin:  Intact without significant lesions or rashes. Eyes:  Sclera clear, no icterus.   Conjunctiva pink.    Impression:   82 year old gentleman he remains quite functional recently with symptomatic hemorrhoids including a thrombosed hemorrhoid for status post banding therapy. All in all, his hemorrhage symptoms have improved. Constipation continues to be a significant issue which is under minor efforts at treating his hemorrhoids. I really don't detect any alarm features at this time. Do not feel we need to do another colonoscopy this point in time. We need to focus on improving bowel function  Recommendations:  Information on constipation provided  Continue Metamucil daily  Trial of Linzess 145 daily to facilitate bowel function.  Our goal is at least easy bowel movement daily to every other day  Let us know how things are going in 2 weeks and we will go from there  Plan for the office visit in 8 weeks  I do not feel you need any further treatment of your hemorrhoids at this time. I feel that if we get your bowel function improved,  hemorrhoids will continue to settle down     Notice: This dictation was prepared with Dragon dictation along with smaller phrase technology. Any transcriptional errors that result from this process are unintentional and may not be corrected upon review.

## 2015-07-13 NOTE — Patient Instructions (Signed)
Information on constipation provided  Continue Metamucil daily  Trial of Linzess 145 daily to facilitate bowel function.  Our goal is at least easy bowel movement daily to every other day  Let us know how things are going in 2 weeks and we will go from there  Plan for the office visit in 8 weeks  I do not feel you need any further treatment of your hemorrhoids at this time. I feel that if we get your bowel function improved,  hemorrhoids will continue to settle down

## 2015-08-09 ENCOUNTER — Encounter: Payer: Self-pay | Admitting: Internal Medicine

## 2015-09-07 ENCOUNTER — Encounter: Payer: Self-pay | Admitting: Internal Medicine

## 2015-09-07 ENCOUNTER — Ambulatory Visit (INDEPENDENT_AMBULATORY_CARE_PROVIDER_SITE_OTHER): Payer: Medicare Other | Admitting: Internal Medicine

## 2015-09-07 VITALS — BP 118/64 | HR 64 | Temp 97.6°F | Ht 68.0 in | Wt 171.0 lb

## 2015-09-07 DIAGNOSIS — K921 Melena: Secondary | ICD-10-CM | POA: Diagnosis not present

## 2015-09-07 DIAGNOSIS — K649 Unspecified hemorrhoids: Secondary | ICD-10-CM

## 2015-09-07 NOTE — Patient Instructions (Signed)
I will review lab work when done by Dr Sherwood GamblerFusco  Continue healty diet habits; may use over the counter laxatives as needed  You may need a minor surgical procedure to alleviate the "dangling" from your rectum  I'll make further recommendations in the near future

## 2015-09-07 NOTE — Progress Notes (Signed)
Primary Care Physician:  Cassell SmilesFUSCO,LAWRENCE J., MD Primary Gastroenterologist:  Dr. Jena Gaussourk  Pre-Procedure History & Physical: HPI:  Jacob Sullivan is a 82 y.o. male here for follow-up of protruding anal lesion and intermittent bleeding. Status post hemorrhoid banding. Moves bowels regularly daily. This 82 year old remains extremely active. 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 He picks up heavy objects frequently. He mows yards with a push mower. Just yesterday,  He installed a heavy of oven vent which required quite a bit of overhead work and straining. Complains of insidiously worsening fatigue  Intermittent rectal  bleeding persist but only when wipes;  he does see it in the toilet water;   He continues to be bothered by a protruding lesion.  Past Medical History  Diagnosis Date  . Irregular heartbeat   . Hypertension   . Diabetes (HCC)   . Osteoarthritis   . Chronic kidney disease   . BPH (benign prostatic hyperplasia)   . Hemorrhoids     Past Surgical History  Procedure Laterality Date  . Medial partial knee replacement    . Rotator cuff repair    . Inguinal hernia repair    . Colonoscopy  Nov 2010    Dr. Karilyn Cotaehman: prolapsing internal hemorrhoids, few small diverticula at sigmoid and ascending colon  . Hemorrhoid banding  2016    Dr.Rourk    Prior to Admission medications   Medication Sig Start Date End Date Taking? Authorizing Provider  acetaminophen (TYLENOL) 325 MG tablet Take 2 tablets (650 mg total) by mouth every 6 (six) hours as needed (or Fever >/= 101). 03/15/12   [name], MD  amLODipine (NORVASC) 5 MG tablet Take 5 mg by mouth daily.    Historical Provider, MD  aspirin EC 81 MG tablet Take 81 mg by mouth daily.    Historical Provider, MD  bisacodyl (DULCOLAX) 5 MG EC tablet Take 5 mg by mouth daily as needed for moderate constipation. Reported on 07/13/2015    Historical Provider, MD  cetirizine (ZYRTEC) 10 MG tablet Take 10 mg by mouth daily.     Historical Provider, MD  Garlic 1000 MG CAPS Take 1,000 mg by mouth daily. Reported on 07/13/2015    Historical Provider, MD  hydrocortisone (ANUSOL-HC) 2.5 % rectal cream Place 1 application rectally 2 (two) times daily. 08/21/14   Nira RetortAnna W Sams, NP  Iron-Vitamins (GERITOL COMPLETE PO) Take by mouth.    Historical Provider, MD  Loratadine (CLARITIN) 10 MG CAPS Take by mouth. Pt rotates with Zyrtec daily    Historical Provider, MD  losartan-hydrochlorothiazide (HYZAAR) 100-25 MG per tablet Take 1 tablet by mouth daily.    Historical Provider, MD  Multiple Vitamin (MULTIVITAMIN WITH MINERALS) TABS Take 1 tablet by mouth daily.    Historical Provider, MD  NON FORMULARY Reported on 07/13/2015    Historical Provider, MD  polyethylene glycol (MIRALAX / GLYCOLAX) packet Take 17 g by mouth daily.    Historical Provider, MD  psyllium (METAMUCIL) 58.6 % powder Take 1 packet by mouth 2 (two) times daily. As needed    Historical Provider, MD  simethicone (MYLICON) 125 MG chewable tablet Chew 125 mg by mouth every 6 (six) hours as needed. gas    Historical Provider, MD  Tamsulosin HCl (FLOMAX) 0.4 MG CAPS Take 1 capsule (0.4 mg total) by mouth at bedtime. 03/15/12   [name], MD  vitamin B-12 (CYANOCOBALAMIN) 1000 MCG tablet Take 1,000 mcg by mouth daily as needed. Reported on  07/13/2015    Historical Provider, MD  vitamin C (ASCORBIC ACID) 500 MG tablet Take 500 mg by mouth daily.    Historical Provider, MD    Allergies as of 09/07/2015  . (No Known Allergies)    Family History  Problem Relation Age of Onset  . Colon cancer Neg Hx     Social History   Social History  . Marital Status: Married    Spouse Name: N/A  . Number of Children: N/A  . Years of Education: N/A   Occupational History  . Not on file.   Social History Main Topics  . Smoking status: Never Smoker   . Smokeless tobacco: Not on file     Comment: Never smoked for any length of time  . Alcohol Use: No  . Drug Use: No    . Sexual Activity: Not on file   Other Topics Concern  . Not on file   Social History Narrative    Review of Systems: See HPI, otherwise negative ROS  Physical Exam: There were no vitals taken for this visit. General:   Alert,  Mentally sharp, pleasant and cooperative in NAD Skin:  Intact without significant lesions or rashes. Eyes:  Sclera clear, no icterus.   Conjunctiva pink. Neck:  Supple; no masses or thyromegaly. No significant cervical adenopathy. Lungs:  Clear throughout to auscultation.   No wheezes, crackles, or rhonchi. No acute distress. Heart:  Regular rate and rhythm; no murmurs, clicks, rubs,  or gallops. Abdomen: Non-distended, normal bowel sounds.  Soft and nontender without appreciable mass or hepatosplenomegaly.  Pulses:  Normal pulses noted. Extremities:  Without clubbing or edema. Rectal:  Slightly effaced anal rectal mucosa with a single  Protruding hypertrophied anal papilla.Digital exam reveals no mass in the rectal vault.   Impression:   Pleasant  82 year old gentleman status post hemorrhoid banding previously he still perceives a slight protrusion.   He has experiencing increased fatigue recently although is extremely active 82 year old. 7 years since he had a colonoscopy; sees a bit of blood only with wiping.This is likely from benign source however, it is somewhat bothersome. He is to have labs through his PCP in the near future.  Patient has not had labs ina couple of years.  Would like to see where we Stand as far as hemoglobin and  Hematocrit are concerned. I do not feel he would derive further benefit from any more  Banding;  I suspect he may need a minor surgical procedure to relieve this aggravating problem.   I also told him Would not close the door on one more colonoscopy as well.  Recommendations:  I will review lab work when done by Dr Sherwood Gambler  Continue healty diet habits; may use over the counter laxatives as needed  You may need a minor  surgical procedure to alleviate the "dangling" from your rectum  I'll make further recommendations in the near future   Notice: This dictation was prepared with Dragon dictation along with smaller phrase technology. Any transcriptional errors that result from this process are unintentional and may not be corrected upon review.

## 2015-09-08 ENCOUNTER — Other Ambulatory Visit (HOSPITAL_COMMUNITY): Payer: Self-pay | Admitting: Internal Medicine

## 2015-09-08 ENCOUNTER — Ambulatory Visit (HOSPITAL_COMMUNITY)
Admission: RE | Admit: 2015-09-08 | Discharge: 2015-09-08 | Disposition: A | Discharge status: Home / Self Care | Payer: Medicare Other | Source: Ambulatory Visit | Attending: Internal Medicine | Admitting: Internal Medicine

## 2015-09-08 DIAGNOSIS — N400 Enlarged prostate without lower urinary tract symptoms: Secondary | ICD-10-CM | POA: Diagnosis not present

## 2015-09-08 DIAGNOSIS — Z1389 Encounter for screening for other disorder: Secondary | ICD-10-CM | POA: Diagnosis not present

## 2015-09-08 DIAGNOSIS — E781 Pure hyperglyceridemia: Secondary | ICD-10-CM | POA: Diagnosis not present

## 2015-09-08 DIAGNOSIS — R109 Unspecified abdominal pain: Secondary | ICD-10-CM | POA: Diagnosis not present

## 2015-09-08 DIAGNOSIS — I10 Essential (primary) hypertension: Secondary | ICD-10-CM | POA: Diagnosis not present

## 2015-09-08 DIAGNOSIS — M1991 Primary osteoarthritis, unspecified site: Secondary | ICD-10-CM | POA: Diagnosis not present

## 2015-09-08 DIAGNOSIS — Z6826 Body mass index (BMI) 26.0-26.9, adult: Secondary | ICD-10-CM | POA: Diagnosis not present

## 2015-09-10 ENCOUNTER — Telehealth: Payer: Self-pay | Admitting: Internal Medicine

## 2015-09-10 NOTE — Telephone Encounter (Signed)
Reviewed labs acquired from Dr. Sharyon MedicusFusco's office. Most recent labs from February 2016 Knopf count 6.5 H&H 13.5 and 41.5 MCV 86. Electrolytes and LFTs all look good. Patient now has fatigue. It's been a year and a half since he had an H&H. Let's repeat a CBC now.

## 2015-09-13 NOTE — Telephone Encounter (Signed)
Tried to call pt- NA- LMOM 

## 2015-09-14 NOTE — Telephone Encounter (Signed)
Labs requested

## 2015-09-14 NOTE — Telephone Encounter (Signed)
Spoke with the pt. He said he just had some more blood work done at Dr.Fusco's office. Darl PikesSusan, will you try to get a copy please.

## 2015-10-21 ENCOUNTER — Encounter: Payer: Self-pay | Admitting: Internal Medicine

## 2015-11-22 ENCOUNTER — Encounter (HOSPITAL_COMMUNITY): Payer: Self-pay

## 2015-11-22 ENCOUNTER — Other Ambulatory Visit (HOSPITAL_COMMUNITY): Payer: Self-pay | Admitting: Internal Medicine

## 2015-11-22 ENCOUNTER — Ambulatory Visit (HOSPITAL_COMMUNITY)
Admission: RE | Admit: 2015-11-22 | Discharge: 2015-11-22 | Disposition: A | Discharge status: Home / Self Care | Payer: Medicare Other | Source: Ambulatory Visit | Attending: Internal Medicine | Admitting: Internal Medicine

## 2015-11-22 DIAGNOSIS — E119 Type 2 diabetes mellitus without complications: Secondary | ICD-10-CM | POA: Diagnosis not present

## 2015-11-22 DIAGNOSIS — N200 Calculus of kidney: Secondary | ICD-10-CM | POA: Insufficient documentation

## 2015-11-22 DIAGNOSIS — N2882 Megaloureter: Secondary | ICD-10-CM | POA: Diagnosis not present

## 2015-11-22 DIAGNOSIS — K7689 Other specified diseases of liver: Secondary | ICD-10-CM | POA: Insufficient documentation

## 2015-11-22 DIAGNOSIS — R1011 Right upper quadrant pain: Secondary | ICD-10-CM

## 2015-11-22 DIAGNOSIS — N2889 Other specified disorders of kidney and ureter: Secondary | ICD-10-CM

## 2015-11-22 DIAGNOSIS — Z1389 Encounter for screening for other disorder: Secondary | ICD-10-CM | POA: Diagnosis not present

## 2015-11-22 DIAGNOSIS — N400 Enlarged prostate without lower urinary tract symptoms: Secondary | ICD-10-CM | POA: Diagnosis not present

## 2015-11-22 DIAGNOSIS — N281 Cyst of kidney, acquired: Secondary | ICD-10-CM | POA: Insufficient documentation

## 2015-11-22 DIAGNOSIS — E663 Overweight: Secondary | ICD-10-CM | POA: Diagnosis not present

## 2015-11-22 DIAGNOSIS — R5383 Other fatigue: Secondary | ICD-10-CM | POA: Diagnosis not present

## 2015-11-22 DIAGNOSIS — Z6826 Body mass index (BMI) 26.0-26.9, adult: Secondary | ICD-10-CM | POA: Diagnosis not present

## 2015-11-22 LAB — POCT I-STAT CREATININE: Creatinine, Ser: 0.8 mg/dL (ref 0.61–1.24)

## 2015-11-22 MED ORDER — IOPAMIDOL (ISOVUE-300) INJECTION 61%
100.0000 mL | Freq: Once | INTRAVENOUS | Status: AC | PRN
Start: 2015-11-22 — End: 2015-11-22
  Administered 2015-11-22: 100 mL via INTRAVENOUS

## 2016-01-06 DIAGNOSIS — Z23 Encounter for immunization: Secondary | ICD-10-CM | POA: Diagnosis not present

## 2016-04-17 DIAGNOSIS — Z6827 Body mass index (BMI) 27.0-27.9, adult: Secondary | ICD-10-CM | POA: Diagnosis not present

## 2016-04-17 DIAGNOSIS — L309 Dermatitis, unspecified: Secondary | ICD-10-CM | POA: Diagnosis not present

## 2016-04-17 DIAGNOSIS — E1129 Type 2 diabetes mellitus with other diabetic kidney complication: Secondary | ICD-10-CM | POA: Diagnosis not present

## 2016-04-17 DIAGNOSIS — Z1389 Encounter for screening for other disorder: Secondary | ICD-10-CM | POA: Diagnosis not present

## 2016-05-18 ENCOUNTER — Encounter (HOSPITAL_COMMUNITY): Payer: Self-pay | Admitting: Emergency Medicine

## 2016-05-18 ENCOUNTER — Emergency Department (HOSPITAL_COMMUNITY)
Admission: EM | Admit: 2016-05-18 | Discharge: 2016-05-18 | Disposition: A | Discharge status: Home / Self Care | Payer: Medicare Other | Attending: Emergency Medicine | Admitting: Emergency Medicine

## 2016-05-18 DIAGNOSIS — K648 Other hemorrhoids: Secondary | ICD-10-CM

## 2016-05-18 DIAGNOSIS — Z79899 Other long term (current) drug therapy: Secondary | ICD-10-CM | POA: Diagnosis not present

## 2016-05-18 DIAGNOSIS — N189 Chronic kidney disease, unspecified: Secondary | ICD-10-CM | POA: Diagnosis not present

## 2016-05-18 DIAGNOSIS — K625 Hemorrhage of anus and rectum: Secondary | ICD-10-CM

## 2016-05-18 DIAGNOSIS — I129 Hypertensive chronic kidney disease with stage 1 through stage 4 chronic kidney disease, or unspecified chronic kidney disease: Secondary | ICD-10-CM | POA: Insufficient documentation

## 2016-05-18 DIAGNOSIS — E1122 Type 2 diabetes mellitus with diabetic chronic kidney disease: Secondary | ICD-10-CM | POA: Insufficient documentation

## 2016-05-18 DIAGNOSIS — Z7984 Long term (current) use of oral hypoglycemic drugs: Secondary | ICD-10-CM | POA: Diagnosis not present

## 2016-05-18 DIAGNOSIS — Z7982 Long term (current) use of aspirin: Secondary | ICD-10-CM | POA: Diagnosis not present

## 2016-05-18 DIAGNOSIS — K922 Gastrointestinal hemorrhage, unspecified: Secondary | ICD-10-CM | POA: Diagnosis present

## 2016-05-18 LAB — I-STAT CHEM 8, ED
BUN: 21 mg/dL — ABNORMAL HIGH (ref 6–20)
CREATININE: 0.9 mg/dL (ref 0.61–1.24)
Calcium, Ion: 1.18 mmol/L (ref 1.15–1.40)
Chloride: 103 mmol/L (ref 101–111)
Glucose, Bld: 109 mg/dL — ABNORMAL HIGH (ref 65–99)
HCT: 43 % (ref 39.0–52.0)
HEMOGLOBIN: 14.6 g/dL (ref 13.0–17.0)
POTASSIUM: 4.2 mmol/L (ref 3.5–5.1)
Sodium: 143 mmol/L (ref 135–145)
TCO2: 29 mmol/L (ref 0–100)

## 2016-05-18 MED ORDER — HYDROCORTISONE 2.5 % RE CREA: TOPICAL_CREAM | RECTAL | 0 refills | Status: AC

## 2016-05-18 NOTE — ED Provider Notes (Signed)
[facility]-EMERGENCY DEPT Provider Note   CSN: [id number] Arrival date & time: 05/18/16  0901     History   Chief Complaint Chief Complaint  Patient presents with  . GI Bleeding    HPI Jacob Sullivan is a 83 y.o. male presenting with brbpr, which was more blood then the normal small trace of blood he sees each morning when wiping and which is felt to be from an internal hemorrhoid.  He has been followed by Dr. Jena Gauss for external hemorrhoids and a banding procedure in 2016 but has also internal hemorrhoid that was recommended general surgery for resolution.  He was referred to a surgeon in [place] but patient was reluctant to travel this distance so the internal hemorrhoid was never followed up.  His last colonoscopy was in 2010 revealing for this prolapsing internal hemorrhoid and a few small diverticula.  He was recommended repeat screening in 10 years.  Pt denies abdominal pain, nausea, vomiting, fevers, unexplained weight loss, dizziness or constipation.  He denies rectal pain, describes an unpleasant pressure sensation. He uses a steroid cream twice daily for treatment of this hemorrhoid.  The history is provided by the patient, the spouse and medical records.    Past Medical History:  Diagnosis Date  . BPH (benign prostatic hyperplasia)   . Chronic kidney disease   . Diabetes (HCC)   . Hemorrhoids   . Hypertension   . Irregular heartbeat   . Osteoarthritis     Patient Active Problem List   Diagnosis Date Noted  . Internal prolapsed hemorrhoids 08/21/2014  . Abnormal EKG 03/14/2012  . UTI (lower urinary tract infection) 03/13/2012  . SIRS (systemic inflammatory response syndrome) (HCC) 03/13/2012  . Essential hypertension 03/13/2012  . Renal insufficiency 03/13/2012    Past Surgical History:  Procedure Laterality Date  . COLONOSCOPY  Nov 2010   Dr. Karilyn Cota: prolapsing internal hemorrhoids, few small diverticula at sigmoid and ascending colon  . HEMORRHOID BANDING  2016   Dr.Rourk  . INGUINAL HERNIA REPAIR    . MEDIAL PARTIAL KNEE REPLACEMENT    . ROTATOR CUFF REPAIR         Home Medications    Prior to Admission medications   Medication Sig Start Date End Date Taking? Authorizing Provider  acetaminophen (TYLENOL) 325 MG tablet Take 2 tablets (650 mg total) by mouth every 6 (six) hours as needed (or Fever >/= 101). 03/15/12  Yes Christiane Ha, MD  amLODipine (NORVASC) 5 MG tablet Take 5 mg by mouth daily.   Yes Historical Provider, MD  aspirin EC 81 MG tablet Take 81 mg by mouth daily.   Yes Historical Provider, MD  cetirizine (ZYRTEC) 10 MG tablet Take 10 mg by mouth daily.   Yes Historical Provider, MD  Garlic 1000 MG CAPS Take 1,000 mg by mouth daily. Reported on 07/13/2015   Yes Historical Provider, MD  Iron-Vitamins (GERITOL COMPLETE PO) Take by mouth.   Yes Historical Provider, MD  Loratadine (CLARITIN) 10 MG CAPS Take by mouth. Pt rotates with Zyrtec daily   Yes Historical Provider, MD  losartan-hydrochlorothiazide (HYZAAR) 100-25 MG per tablet Take 1 tablet by mouth daily.   Yes Historical Provider, MD  Multiple Vitamin (MULTIVITAMIN WITH MINERALS) TABS Take 1 tablet by mouth daily.   Yes Historical Provider, MD  psyllium (METAMUCIL) 58.6 % powder Take 1 packet by mouth 2 (two) times daily. As needed   Yes Historical Provider, MD  simethicone (MYLICON) 125 MG chewable tablet Chew 125 mg by mouth  every 6 (six) hours as needed. gas   Yes Historical Provider, MD  Tamsulosin HCl (FLOMAX) 0.4 MG CAPS Take 1 capsule (0.4 mg total) by mouth at bedtime. 03/15/12  Yes [name], MD  vitamin C (ASCORBIC ACID) 500 MG tablet Take 500 mg by mouth daily.   Yes Historical Provider, MD  hydrocortisone (ANUSOL-HC) 2.5 % rectal cream Apply rectally 2 times daily 05/18/16   Burgess AmorJulie Jamae Tison, PA-C    Family History Family History  Problem Relation Age of Onset  . Colon cancer Neg Hx     Social History Social History  Substance Use Topics  . Smoking  status: Never Smoker  . Smokeless tobacco: Not on file     Comment: Never smoked for any length of time  . Alcohol use No     Allergies   Patient has no known allergies.   Review of Systems Review of Systems  Constitutional: Negative for fever.  HENT: Negative for congestion and sore throat.   Eyes: Negative.   Respiratory: Negative for chest tightness and shortness of breath.   Cardiovascular: Negative for chest pain.  Gastrointestinal: Positive for blood in stool. Negative for abdominal pain, constipation, diarrhea, nausea, rectal pain and vomiting.  Genitourinary: Negative.   Musculoskeletal: Negative for arthralgias, joint swelling and neck pain.  Skin: Negative.  Negative for rash and wound.  Neurological: Negative for dizziness, weakness, light-headedness, numbness and headaches.  Psychiatric/Behavioral: Negative.      Physical Exam Updated Vital Signs BP 137/85 (BP Location: Left Arm)   Pulse 91   Temp 98.1 F (36.7 C) (Oral)   Resp 18   Ht 5\' 8"  (1.727 m)   Wt 72.6 kg   SpO2 100%   BMI 24.33 kg/m   Physical Exam  Constitutional: He appears well-developed and well-nourished.  HENT:  Head: Normocephalic and atraumatic.  Eyes: Conjunctivae are normal.  Neck: Normal range of motion.  Cardiovascular: Normal rate, regular rhythm, normal heart sounds and intact distal pulses.   Pulmonary/Chest: Effort normal and breath sounds normal. He has no wheezes.  Abdominal: Soft. Bowel sounds are normal. There is no tenderness.  Genitourinary: Rectal exam shows external hemorrhoid and internal hemorrhoid.  Genitourinary Comments: Multiple old external skin tags c/w prior hemorrhoids.  3 equal sized mucosal protrusions within the anal verge, one with area of abrasion and trace of blood present but no significant active bleeding.  Musculoskeletal: Normal range of motion.  Neurological: He is alert.  Skin: Skin is warm and dry.  Psychiatric: He has a normal mood and affect.    Nursing note and vitals reviewed.    ED Treatments / Results  Labs (all labs ordered are listed, but only abnormal results are displayed) Labs Reviewed  I-STAT CHEM 8, ED - Abnormal; Notable for the following:       Result Value   BUN 21 (*)    Glucose, Bld 109 (*)    All other components within normal limits    EKG  EKG Interpretation None       Radiology No results found.  Procedures Procedures (including critical care time)  Medications Ordered in ED Medications - No data to display   Initial Impression / Assessment and Plan / ED Course  I have reviewed the triage vital signs and the nursing notes.  Pertinent labs & imaging results that were available during my care of the patient were reviewed by me and considered in my medical decision making (see chart for details).  Pt also seen by Dr. Hyacinth Meeker during this visit. VSS, no anemia from blood loss.  Internal hemorrhoid as probable source of bleeding.  Discussed eval by surgeon as this was the original rec from GI.  Pt agreeable with this, referral given to Dr. Lovell Sheehan. Pt to call for appt.  Pt stable for dc home, advised return here for any worsened sx. anusol.  Final Clinical Impressions(s) / ED Diagnoses   Final diagnoses:  Internal hemorrhoid  Rectal bleeding    New Prescriptions Discharge Medication List as of 05/18/2016 11:27 AM       Burgess Amor, PA-C 05/19/16 1610    Eber Hong, MD 05/19/16 [phone]

## 2016-05-18 NOTE — ED Triage Notes (Signed)
Pt reports having hx of hemorrhoids, had banding performed last year.  Pt is unsure if he still has them.  Pt states that this morning pt got up to have bm and noticed bright red blood in stool.  Denies pain.  ALert and oriented.

## 2016-10-16 IMAGING — DX DG ABDOMEN ACUTE W/ 1V CHEST
4 series · 4 of 4 positions shown · non-contrast
Comparison: Chest x-ray of 03/13/2012

CLINICAL DATA: Right flank pain

EXAM:
DG ABDOMEN ACUTE W/ 1V CHEST

[chest pa]
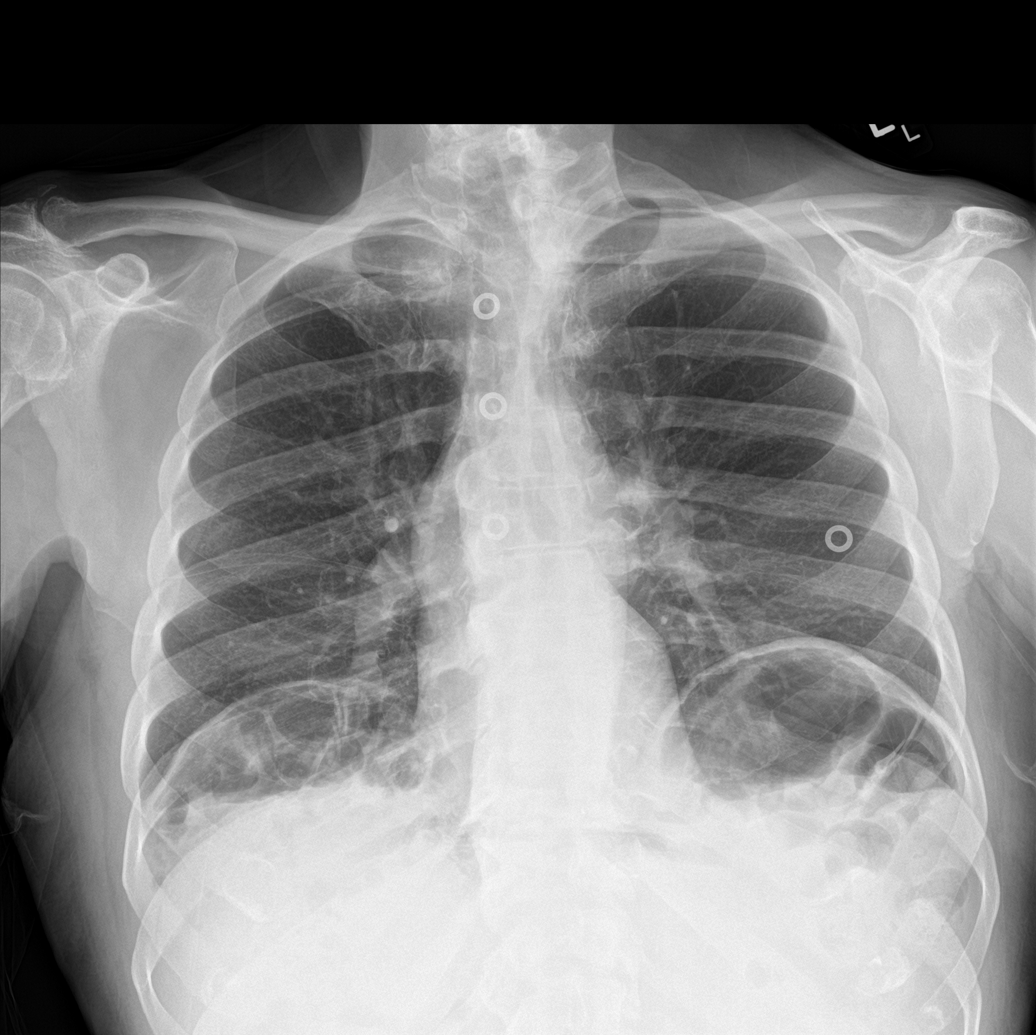

[abdomen erect]
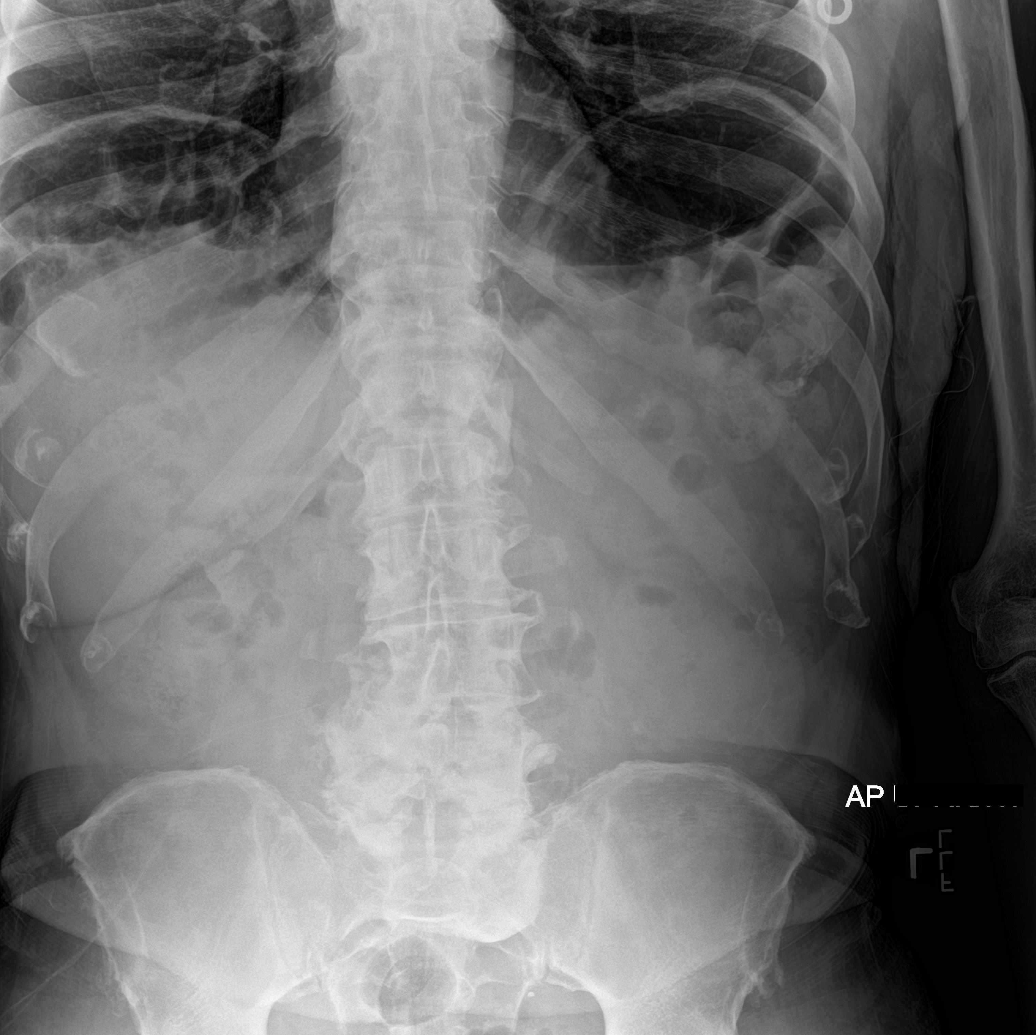

[abdomen supine (1 of 2)]
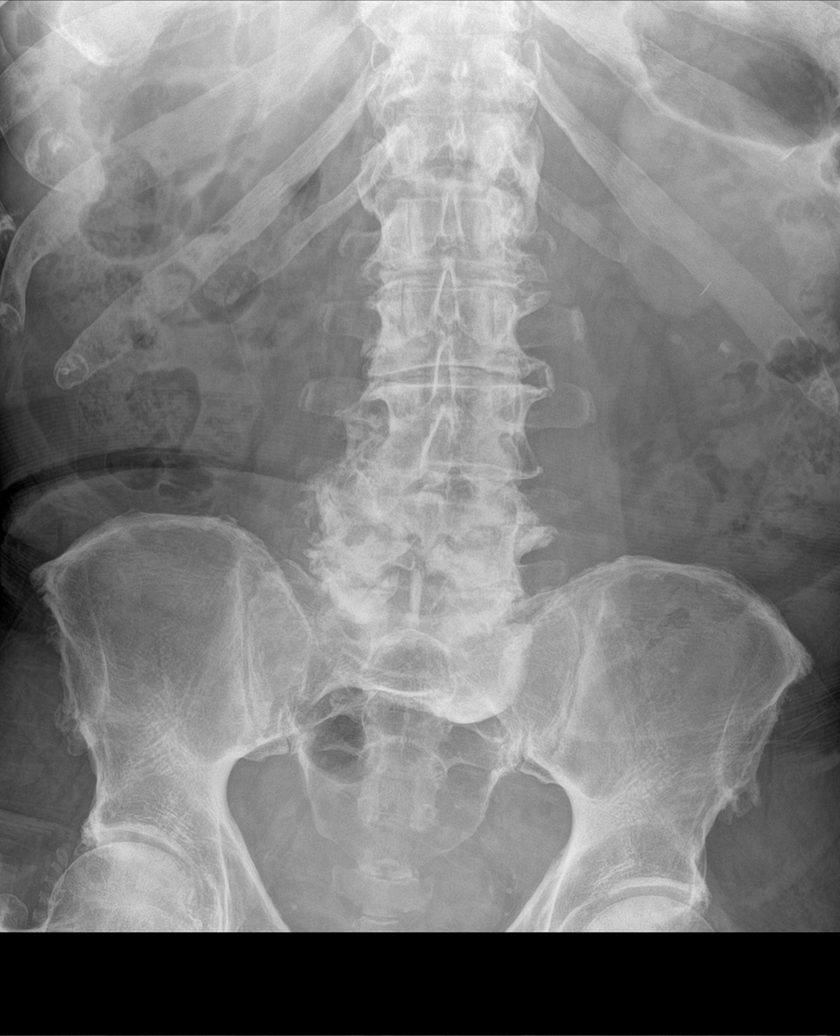

[abdomen supine (2 of 2)]
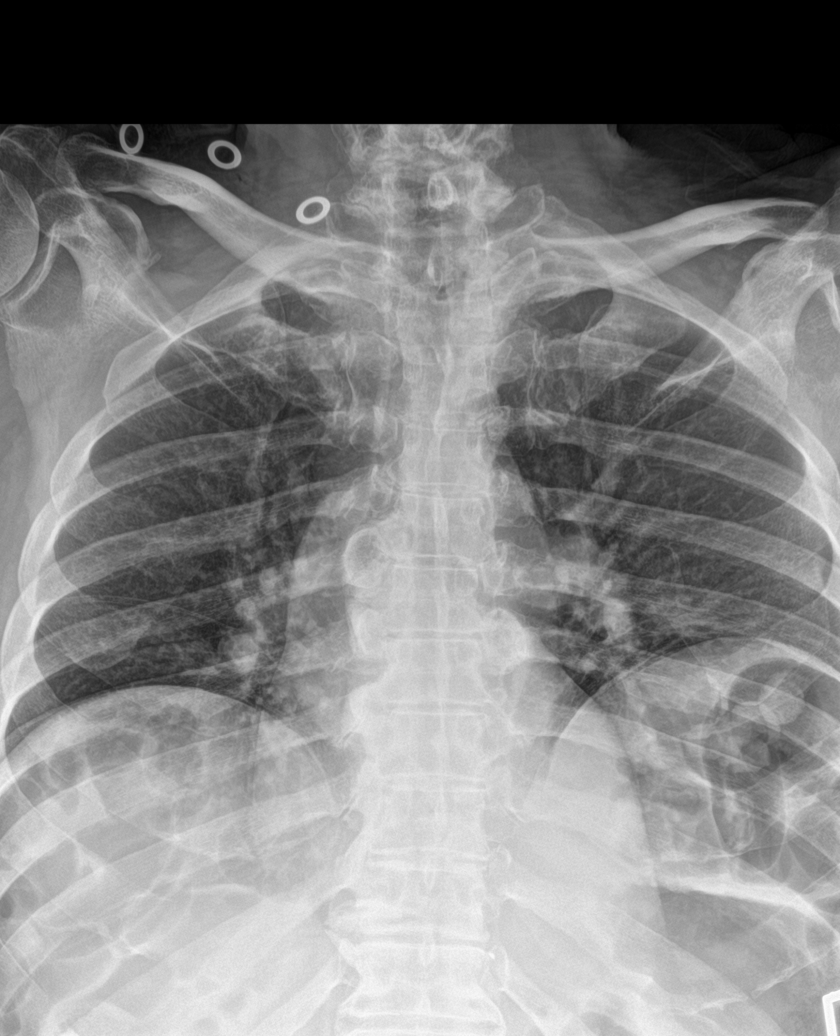

[4 of 4 positions shown; findings below may reference images not displayed]

FINDINGS: No active infiltrate or effusion is seen. Bowel gas is noted under
both hemidiaphragms consistent with both the splenic and hepatic
flexures of colon. Mediastinal and hilar contours are unremarkable.
The heart is within normal limits in size.

Supine and erect views the abdomen show no bowel obstruction. There
is moderate amount of feces throughout the colon. No opaque calculi
are seen. Densities on the left appear to be within bowel. There is
a questionable rounded soft tissue opacity lying near the lower pole
left kidney which could represent a left lower pole mass or cyst.
Ultrasound would be helpful to assess further. Faint calcification
lies along the periphery of this lesion and again further assessment
by ultrasound or CT is recommended. There are considerable
degenerative changes in the lower lumbar spine.
IMPRESSION: 1. No active infiltrate or effusion.
2. No bowel obstruction. No free air. Bowel gas lies beneath both
hemidiaphragms.
3. Cannot exclude rounded mass in the region of the lower pole of
the left kidney with faint peripheral calcification. Consider
ultrasound or CT to assess further.
4. Considerable degenerative change of the lower lumbar spine.

## 2016-12-08 DIAGNOSIS — N400 Enlarged prostate without lower urinary tract symptoms: Secondary | ICD-10-CM | POA: Diagnosis not present

## 2016-12-08 DIAGNOSIS — E663 Overweight: Secondary | ICD-10-CM | POA: Diagnosis not present

## 2016-12-08 DIAGNOSIS — M1991 Primary osteoarthritis, unspecified site: Secondary | ICD-10-CM | POA: Diagnosis not present

## 2016-12-08 DIAGNOSIS — E119 Type 2 diabetes mellitus without complications: Secondary | ICD-10-CM | POA: Diagnosis not present

## 2016-12-08 DIAGNOSIS — Z6826 Body mass index (BMI) 26.0-26.9, adult: Secondary | ICD-10-CM | POA: Diagnosis not present

## 2016-12-08 DIAGNOSIS — Z1389 Encounter for screening for other disorder: Secondary | ICD-10-CM | POA: Diagnosis not present

## 2017-04-02 DIAGNOSIS — D649 Anemia, unspecified: Secondary | ICD-10-CM | POA: Diagnosis not present

## 2017-04-02 DIAGNOSIS — E748 Other specified disorders of carbohydrate metabolism: Secondary | ICD-10-CM | POA: Diagnosis not present

## 2017-05-03 DIAGNOSIS — E748 Other specified disorders of carbohydrate metabolism: Secondary | ICD-10-CM | POA: Diagnosis not present

## 2017-05-03 DIAGNOSIS — M1991 Primary osteoarthritis, unspecified site: Secondary | ICD-10-CM | POA: Diagnosis not present

## 2017-05-03 DIAGNOSIS — Z6826 Body mass index (BMI) 26.0-26.9, adult: Secondary | ICD-10-CM | POA: Diagnosis not present

## 2017-05-03 DIAGNOSIS — Z0000 Encounter for general adult medical examination without abnormal findings: Secondary | ICD-10-CM | POA: Diagnosis not present

## 2017-08-07 DIAGNOSIS — Z6826 Body mass index (BMI) 26.0-26.9, adult: Secondary | ICD-10-CM | POA: Diagnosis not present

## 2017-08-07 DIAGNOSIS — E663 Overweight: Secondary | ICD-10-CM | POA: Diagnosis not present

## 2017-08-07 DIAGNOSIS — M545 Low back pain: Secondary | ICD-10-CM | POA: Diagnosis not present

## 2017-11-21 DIAGNOSIS — H524 Presbyopia: Secondary | ICD-10-CM | POA: Diagnosis not present

## 2017-11-21 DIAGNOSIS — H52221 Regular astigmatism, right eye: Secondary | ICD-10-CM | POA: Diagnosis not present

## 2017-11-21 DIAGNOSIS — H25813 Combined forms of age-related cataract, bilateral: Secondary | ICD-10-CM | POA: Diagnosis not present

## 2017-11-21 DIAGNOSIS — H5203 Hypermetropia, bilateral: Secondary | ICD-10-CM | POA: Diagnosis not present

## 2017-12-03 DIAGNOSIS — M47816 Spondylosis without myelopathy or radiculopathy, lumbar region: Secondary | ICD-10-CM | POA: Diagnosis not present

## 2017-12-03 DIAGNOSIS — Z6825 Body mass index (BMI) 25.0-25.9, adult: Secondary | ICD-10-CM | POA: Diagnosis not present

## 2017-12-03 DIAGNOSIS — M5417 Radiculopathy, lumbosacral region: Secondary | ICD-10-CM | POA: Diagnosis not present

## 2017-12-03 DIAGNOSIS — I10 Essential (primary) hypertension: Secondary | ICD-10-CM | POA: Diagnosis not present

## 2018-01-10 DIAGNOSIS — Z23 Encounter for immunization: Secondary | ICD-10-CM | POA: Diagnosis not present

## 2018-06-05 DIAGNOSIS — J302 Other seasonal allergic rhinitis: Secondary | ICD-10-CM | POA: Diagnosis not present

## 2018-06-05 DIAGNOSIS — Z1389 Encounter for screening for other disorder: Secondary | ICD-10-CM | POA: Diagnosis not present

## 2018-06-05 DIAGNOSIS — E663 Overweight: Secondary | ICD-10-CM | POA: Diagnosis not present

## 2018-06-05 DIAGNOSIS — Z0001 Encounter for general adult medical examination with abnormal findings: Secondary | ICD-10-CM | POA: Diagnosis not present

## 2018-06-05 DIAGNOSIS — Z6825 Body mass index (BMI) 25.0-25.9, adult: Secondary | ICD-10-CM | POA: Diagnosis not present

## 2019-01-01 DIAGNOSIS — Z23 Encounter for immunization: Secondary | ICD-10-CM | POA: Diagnosis not present

## 2019-06-17 DIAGNOSIS — Z6824 Body mass index (BMI) 24.0-24.9, adult: Secondary | ICD-10-CM | POA: Diagnosis not present

## 2019-06-17 DIAGNOSIS — Z0000 Encounter for general adult medical examination without abnormal findings: Secondary | ICD-10-CM | POA: Diagnosis not present

## 2019-06-17 DIAGNOSIS — J329 Chronic sinusitis, unspecified: Secondary | ICD-10-CM | POA: Diagnosis not present

## 2019-06-17 DIAGNOSIS — J302 Other seasonal allergic rhinitis: Secondary | ICD-10-CM | POA: Diagnosis not present

## 2020-01-13 DIAGNOSIS — Z23 Encounter for immunization: Secondary | ICD-10-CM | POA: Diagnosis not present

## 2020-03-28 ENCOUNTER — Other Ambulatory Visit: Payer: Self-pay

## 2020-03-28 ENCOUNTER — Emergency Department (HOSPITAL_COMMUNITY)
Admission: EM | Admit: 2020-03-28 | Discharge: 2020-03-28 | Disposition: A | Discharge status: Home / Self Care | Payer: Medicare Other | Attending: Emergency Medicine | Admitting: Emergency Medicine

## 2020-03-28 DIAGNOSIS — K644 Residual hemorrhoidal skin tags: Secondary | ICD-10-CM | POA: Diagnosis not present

## 2020-03-28 DIAGNOSIS — Z7982 Long term (current) use of aspirin: Secondary | ICD-10-CM | POA: Diagnosis not present

## 2020-03-28 DIAGNOSIS — Z96659 Presence of unspecified artificial knee joint: Secondary | ICD-10-CM | POA: Insufficient documentation

## 2020-03-28 DIAGNOSIS — Z79899 Other long term (current) drug therapy: Secondary | ICD-10-CM | POA: Diagnosis not present

## 2020-03-28 DIAGNOSIS — N189 Chronic kidney disease, unspecified: Secondary | ICD-10-CM | POA: Insufficient documentation

## 2020-03-28 DIAGNOSIS — I129 Hypertensive chronic kidney disease with stage 1 through stage 4 chronic kidney disease, or unspecified chronic kidney disease: Secondary | ICD-10-CM | POA: Insufficient documentation

## 2020-03-28 DIAGNOSIS — E1122 Type 2 diabetes mellitus with diabetic chronic kidney disease: Secondary | ICD-10-CM | POA: Diagnosis not present

## 2020-03-28 NOTE — ED Provider Notes (Signed)
[facility] EMERGENCY DEPARTMENT Provider Note   CSN: [id number] Arrival date & time: 03/28/20  0418     History Chief Complaint  Patient presents with  . Hemorrhoids    Jacob Sullivan is a 87 y.o. male.  HPI    Patient with history of chronic kidney disease, diabetes presents with hemorrhoids and bleeding. Patient reports has a long history of hemorrhoids and will occasionally have blood when he wipes. However tonight after a bowel movement he noted blood mixed in stool. No melena. No vomiting. No abdominal pain. No rectal pain. He reports frequent bowel movements recently and this typically flares up his hemorrhoids. He has no other acute complaints. Patient is still very active. He is not on anticoagulation  He reports previous history of hemorrhoidal banding Past Medical History:  Diagnosis Date  . BPH (benign prostatic hyperplasia)   . Chronic kidney disease   . Diabetes (HCC)   . Hemorrhoids   . Hypertension   . Irregular heartbeat   . Osteoarthritis     Patient Active Problem List   Diagnosis Date Noted  . Internal prolapsed hemorrhoids 08/21/2014  . Abnormal EKG 03/14/2012  . UTI (lower urinary tract infection) 03/13/2012  . SIRS (systemic inflammatory response syndrome) (HCC) 03/13/2012  . Essential hypertension 03/13/2012  . Renal insufficiency 03/13/2012    Past Surgical History:  Procedure Laterality Date  . COLONOSCOPY  Nov 2010   Dr. Karilyn Cota: prolapsing internal hemorrhoids, few small diverticula at sigmoid and ascending colon  . HEMORRHOID BANDING  2016   Dr.Rourk  . INGUINAL HERNIA REPAIR    . MEDIAL PARTIAL KNEE REPLACEMENT    . ROTATOR CUFF REPAIR         Family History  Problem Relation Age of Onset  . Colon cancer Neg Hx     Social History   Tobacco Use  . Smoking status: Never Smoker  . Tobacco comment: Never smoked for any length of time  Substance Use Topics  . Alcohol use: No    Alcohol/week: 0.0 standard drinks  . Drug use:  No    Home Medications Prior to Admission medications   Medication Sig Start Date End Date Taking? Authorizing Provider  acetaminophen (TYLENOL) 325 MG tablet Take 2 tablets (650 mg total) by mouth every 6 (six) hours as needed (or Fever >/= 101). 03/15/12   Christiane Ha, MD  amLODipine (NORVASC) 5 MG tablet Take 5 mg by mouth daily.    [provider]  aspirin EC 81 MG tablet Take 81 mg by mouth daily.    [provider]  cetirizine (ZYRTEC) 10 MG tablet Take 10 mg by mouth daily.    [provider]  Garlic 1000 MG CAPS Take 1,000 mg by mouth daily. Reported on 07/13/2015    [provider]  hydrocortisone (ANUSOL-HC) 2.5 % rectal cream Apply rectally 2 times daily 05/18/16   Burgess Amor, PA-C  Iron-Vitamins (GERITOL COMPLETE PO) Take by mouth.    [provider]  Loratadine (CLARITIN) 10 MG CAPS Take by mouth. Pt rotates with Zyrtec daily    [provider]  losartan-hydrochlorothiazide (HYZAAR) 100-25 MG per tablet Take 1 tablet by mouth daily.    [provider]  Multiple Vitamin (MULTIVITAMIN WITH MINERALS) TABS Take 1 tablet by mouth daily.    [provider]  psyllium (METAMUCIL) 58.6 % powder Take 1 packet by mouth 2 (two) times daily. As needed    [provider]  simethicone (MYLICON) 125 MG  chewable tablet Chew 125 mg by mouth every 6 (six) hours as needed. gas    [provider]  Tamsulosin HCl (FLOMAX) 0.4 MG CAPS Take 1 capsule (0.4 mg total) by mouth at bedtime. 03/15/12   Christiane Ha, MD  vitamin C (ASCORBIC ACID) 500 MG tablet Take 500 mg by mouth daily.    [provider]    Allergies    Patient has no known allergies.  Review of Systems   Review of Systems  Constitutional: Negative for fever.  Gastrointestinal: Positive for blood in stool. Negative for abdominal pain, diarrhea, rectal pain and vomiting.  Neurological: Negative for weakness.    Physical  Exam Updated Vital Signs BP 101/80 (BP Location: Left Arm)   Pulse 79   Temp 98.3 F (36.8 C)   Resp 18   Ht 1.727 m (5\' 8" )   Wt 68 kg   SpO2 99%   BMI 22.81 kg/m   Physical Exam CONSTITUTIONAL: Elderly, no acute distress HEAD: Normocephalic/atraumatic EYES: EOMI ENMT: Mucous membranes moist NECK: supple no meningeal signs CV: S1/S2 noted LUNGS: Lungs are clear to auscultation bilaterally, no apparent distress ABDOMEN: soft, nontender Rectal-multiple external hemorrhoids noted. There is no bleeding. No melena. No rectal mass. No stool impaction. Stool color light brown. Nurse present for exam NEURO: Pt is awake/alert/appropriate, moves all extremitiesx4.  No facial droop.   EXTREMITIES:  full ROM SKIN: warm, color normal PSYCH: no abnormalities of mood noted, alert and oriented to situation  ED Results / Procedures / Treatments   Labs (all labs ordered are listed, but only abnormal results are displayed) Labs Reviewed  POC OCCULT BLOOD, ED    EKG None  Radiology No results found.  Procedures Procedures  Medications Ordered in ED Medications - No data to display  ED Course  I have reviewed the triage vital signs and the nursing notes.    MDM Rules/Calculators/A&P                          Patient without any active bleeding. He has no other acute complaints. He will be referred to general surgery.  Final Clinical Impression(s) / ED Diagnoses Final diagnoses:  External hemorrhoids    Rx / DC Orders ED Discharge Orders    None       , MD 03/28/20 [phone]

## 2020-03-28 NOTE — ED Notes (Signed)
ED Provider at bedside. Rectal exam chaperoned by this RN

## 2020-03-28 NOTE — ED Triage Notes (Signed)
pov form home. Alert oriented ambulatory. States that when he had a BM this morning he had a little bit of blood from his hemorrhoids . Denies constipation, states he goes fairly frequently.

## 2020-04-05 DIAGNOSIS — H43813 Vitreous degeneration, bilateral: Secondary | ICD-10-CM | POA: Diagnosis not present

## 2020-07-05 DIAGNOSIS — G894 Chronic pain syndrome: Secondary | ICD-10-CM | POA: Diagnosis not present

## 2020-07-05 DIAGNOSIS — Z1389 Encounter for screening for other disorder: Secondary | ICD-10-CM | POA: Diagnosis not present

## 2020-07-05 DIAGNOSIS — J309 Allergic rhinitis, unspecified: Secondary | ICD-10-CM | POA: Diagnosis not present

## 2020-07-05 DIAGNOSIS — D649 Anemia, unspecified: Secondary | ICD-10-CM | POA: Diagnosis not present

## 2020-07-05 DIAGNOSIS — T50905A Adverse effect of unspecified drugs, medicaments and biological substances, initial encounter: Secondary | ICD-10-CM | POA: Diagnosis not present

## 2020-07-05 DIAGNOSIS — Z6823 Body mass index (BMI) 23.0-23.9, adult: Secondary | ICD-10-CM | POA: Diagnosis not present

## 2020-07-05 DIAGNOSIS — Z0001 Encounter for general adult medical examination with abnormal findings: Secondary | ICD-10-CM | POA: Diagnosis not present

## 2020-12-28 DIAGNOSIS — Z23 Encounter for immunization: Secondary | ICD-10-CM | POA: Diagnosis not present

## 2021-02-03 DIAGNOSIS — H5203 Hypermetropia, bilateral: Secondary | ICD-10-CM | POA: Diagnosis not present

## 2021-11-25 DEATH — deceased
